# Patient Record
Sex: Female | Born: 1955 | Race: Asian | Hispanic: No | Marital: Single | Smoking: Never smoker
Health system: Southern US, Community
[De-identification: ages and names within clinical notes are randomized; demographics above are authoritative.]

## PROBLEM LIST (undated history)

## (undated) DIAGNOSIS — K5790 Diverticulosis of intestine, part unspecified, without perforation or abscess without bleeding: Secondary | ICD-10-CM

## (undated) DIAGNOSIS — N3090 Cystitis, unspecified without hematuria: Secondary | ICD-10-CM

## (undated) DIAGNOSIS — D369 Benign neoplasm, unspecified site: Secondary | ICD-10-CM

## (undated) DIAGNOSIS — H269 Unspecified cataract: Secondary | ICD-10-CM

## (undated) DIAGNOSIS — K219 Gastro-esophageal reflux disease without esophagitis: Secondary | ICD-10-CM

## (undated) DIAGNOSIS — M12819 Other specific arthropathies, not elsewhere classified, unspecified shoulder: Secondary | ICD-10-CM

## (undated) DIAGNOSIS — M4722 Other spondylosis with radiculopathy, cervical region: Secondary | ICD-10-CM

## (undated) HISTORY — DX: Other spondylosis with radiculopathy, cervical region: M47.22

## (undated) HISTORY — DX: Benign neoplasm, unspecified site: D36.9

## (undated) HISTORY — DX: Other specific arthropathies, not elsewhere classified, unspecified shoulder: M12.819

## (undated) HISTORY — PX: COLONOSCOPY: SHX174

## (undated) HISTORY — PX: BREAST BIOPSY: SHX20

## (undated) HISTORY — DX: Unspecified cataract: H26.9

## (undated) HISTORY — DX: Cystitis, unspecified without hematuria: N30.90

## (undated) HISTORY — DX: Gastro-esophageal reflux disease without esophagitis: K21.9

## (undated) HISTORY — DX: Diverticulosis of intestine, part unspecified, without perforation or abscess without bleeding: K57.90

---

## 1995-06-18 HISTORY — PX: OVARIAN CYST SURGERY: SHX726

## 1998-12-14 ENCOUNTER — Encounter: Admission: RE | Admit: 1998-12-14 | Discharge: 1998-12-14 | Payer: Self-pay | Admitting: Hematology and Oncology

## 1998-12-14 ENCOUNTER — Ambulatory Visit (HOSPITAL_COMMUNITY): Admission: RE | Admit: 1998-12-14 | Discharge: 1998-12-14 | Payer: Self-pay | Admitting: Hematology and Oncology

## 1999-08-15 ENCOUNTER — Encounter: Admission: RE | Admit: 1999-08-15 | Discharge: 1999-08-15 | Payer: Self-pay | Admitting: Hematology and Oncology

## 1999-11-16 ENCOUNTER — Encounter: Admission: RE | Admit: 1999-11-16 | Discharge: 1999-11-16 | Payer: Self-pay | Admitting: Internal Medicine

## 1999-11-26 ENCOUNTER — Ambulatory Visit (HOSPITAL_COMMUNITY): Admission: RE | Admit: 1999-11-26 | Discharge: 1999-11-26 | Payer: Self-pay | Admitting: *Deleted

## 1999-12-18 ENCOUNTER — Encounter: Admission: RE | Admit: 1999-12-18 | Discharge: 1999-12-18 | Payer: Self-pay | Admitting: Obstetrics

## 1999-12-31 ENCOUNTER — Encounter: Admission: RE | Admit: 1999-12-31 | Discharge: 1999-12-31 | Payer: Self-pay | Admitting: Internal Medicine

## 2000-01-14 ENCOUNTER — Encounter: Admission: RE | Admit: 2000-01-14 | Discharge: 2000-01-14 | Payer: Self-pay | Admitting: Internal Medicine

## 2000-01-21 ENCOUNTER — Encounter: Admission: RE | Admit: 2000-01-21 | Discharge: 2000-01-21 | Payer: Self-pay | Admitting: Internal Medicine

## 2000-04-22 ENCOUNTER — Encounter: Admission: RE | Admit: 2000-04-22 | Discharge: 2000-04-22 | Payer: Self-pay | Admitting: Obstetrics & Gynecology

## 2000-05-22 ENCOUNTER — Ambulatory Visit (HOSPITAL_COMMUNITY): Admission: RE | Admit: 2000-05-22 | Discharge: 2000-05-22 | Payer: Self-pay | Admitting: Obstetrics & Gynecology

## 2012-05-01 ENCOUNTER — Encounter: Payer: Self-pay | Admitting: Internal Medicine

## 2012-05-01 ENCOUNTER — Ambulatory Visit (INDEPENDENT_AMBULATORY_CARE_PROVIDER_SITE_OTHER): Payer: Medicaid Other | Admitting: Internal Medicine

## 2012-05-01 VITALS — BP 123/64 | HR 72 | Temp 98.4°F | Ht 64.0 in | Wt 102.5 lb

## 2012-05-01 DIAGNOSIS — N3090 Cystitis, unspecified without hematuria: Secondary | ICD-10-CM

## 2012-05-01 DIAGNOSIS — M19019 Primary osteoarthritis, unspecified shoulder: Secondary | ICD-10-CM

## 2012-05-01 DIAGNOSIS — R42 Dizziness and giddiness: Secondary | ICD-10-CM | POA: Insufficient documentation

## 2012-05-01 DIAGNOSIS — Z1231 Encounter for screening mammogram for malignant neoplasm of breast: Secondary | ICD-10-CM

## 2012-05-01 DIAGNOSIS — M4722 Other spondylosis with radiculopathy, cervical region: Secondary | ICD-10-CM

## 2012-05-01 DIAGNOSIS — Z0000 Encounter for general adult medical examination without abnormal findings: Secondary | ICD-10-CM | POA: Insufficient documentation

## 2012-05-01 DIAGNOSIS — M4712 Other spondylosis with myelopathy, cervical region: Secondary | ICD-10-CM

## 2012-05-01 DIAGNOSIS — M12819 Other specific arthropathies, not elsewhere classified, unspecified shoulder: Secondary | ICD-10-CM

## 2012-05-01 DIAGNOSIS — Z23 Encounter for immunization: Secondary | ICD-10-CM

## 2012-05-01 DIAGNOSIS — R300 Dysuria: Secondary | ICD-10-CM

## 2012-05-01 DIAGNOSIS — L299 Pruritus, unspecified: Secondary | ICD-10-CM | POA: Insufficient documentation

## 2012-05-01 DIAGNOSIS — K219 Gastro-esophageal reflux disease without esophagitis: Secondary | ICD-10-CM

## 2012-05-01 HISTORY — DX: Cystitis, unspecified without hematuria: N30.90

## 2012-05-01 HISTORY — DX: Other specific arthropathies, not elsewhere classified, unspecified shoulder: M12.819

## 2012-05-01 HISTORY — DX: Gastro-esophageal reflux disease without esophagitis: K21.9

## 2012-05-01 HISTORY — DX: Other spondylosis with radiculopathy, cervical region: M47.22

## 2012-05-01 LAB — POCT URINALYSIS DIPSTICK
Glucose, UA: NEGATIVE
Nitrite, UA: NEGATIVE
Protein, UA: NEGATIVE
Urobilinogen, UA: 0.2

## 2012-05-01 MED ORDER — RANITIDINE HCL 150 MG PO TABS: 150.0000 mg | ORAL_TABLET | Freq: Two times a day (BID) | ORAL | Status: DC

## 2012-05-01 MED ORDER — LORATADINE 10 MG PO TABS: 10.0000 mg | ORAL_TABLET | Freq: Every day | ORAL | Status: DC

## 2012-05-01 MED ORDER — IBUPROFEN 600 MG PO TABS: 600.0000 mg | ORAL_TABLET | Freq: Three times a day (TID) | ORAL | Status: DC | PRN

## 2012-05-01 MED ORDER — SULFAMETHOXAZOLE-TRIMETHOPRIM 800-160 MG PO TABS: 1.0000 | ORAL_TABLET | Freq: Two times a day (BID) | ORAL | Status: AC

## 2012-05-01 NOTE — Assessment & Plan Note (Signed)
She currently has adequate symptom control with PRN ranitidine.  I advised patient that she can take this twice daily every day, but that PRN usage is fine. - continue ranitidine 150mg  BID or PRN

## 2012-05-01 NOTE — Patient Instructions (Addendum)
Start taking ibuprofen 600mg  every 8 hours as needed for your pain.  If ibuprofen is not enough, you can continue taking cyclobenzaprine and hydrocodone-acetaminophen as needed.  Follow up with physical therapy for treatment of you shoulder and arm pain.  Continue taking ranitidine twice a day for acid reflux.  Stop taking meclizine.  Meclizine does not help your type of dizziness.  We are scheduling you for a screening colonoscopy to screen for colon cancer and a mammogram to screen for breast cancer.  Take trimethoprim-sulfamethoxazole (Bactrim DS) every 12 hours for 6 doses (3 days) for a urinary tract infection that was causing the burning with urination.

## 2012-05-01 NOTE — Assessment & Plan Note (Addendum)
Pain and numbness radiating from shoulder into arm and hand with a positive Spurling test very suggestive of cervical spondylosis with radiculopathy.  More distal disruption like in the brachial plexus is possible, but less likely.  We will treat conservatively for now with ibuprofen and referral to physical therapy.  No imaging for now. - ibuprofen 600mg  q8hr PRN - PT

## 2012-05-01 NOTE — Assessment & Plan Note (Signed)
In addition to cervical spondylosis, it seems she has some rotator cuff arthropathy since she has pain restricting internal and external rotation and abduction.  She may have adehsive capsulitis since she requires her left arm to free her right arm from time to time.  Regardless, treatment is similar.  We will treat with ibuprofen and physical therapy. - ibuprofen 600mg  q8 PRN - PT

## 2012-05-01 NOTE — Progress Notes (Signed)
Subjective:    Patient ID: Dana Schultz, female    DOB: May 08, 1956, 56 y.o.   MRN: [id number]  CC: right shoulder pain  HPI:  This is a 56 year old woman who immigrated to the [place] in 1992 from [place] who presents today with an acute complaint and to establish care with [place].  Her shoulder pain began about 3 months ago, but has been worsening over the past 2 to 3 weeks.  No provoking injury can be identified.  Quality is described as aching and a heaviness.  Location is posterior over the scapula with radiation into the arm, forearm, and hand.  Severity is rated 8/10.  Timing is all day, every day.  Lifting things and doing housework aggravates the pain.  Hydrocodone-acetaminophen prescribed by urgent care alleviates the pain.  Cyclobenzaprine, also prescribed by urgent care, does nothing to the pain.  Associated symptoms are occasional neck pain and numbness in the forearm and hand that is relieved by moving her arm and hand around.   Review of Systems  Constitutional: Negative.  Negative for fever and chills.  HENT: Negative for congestion, rhinorrhea and sinus pressure.   Eyes: Negative.  Negative for itching.  Respiratory: Negative.  Negative for shortness of breath.   Cardiovascular: Negative.  Negative for chest pain.  Gastrointestinal: Negative for abdominal pain, diarrhea and constipation.       Occasional reflux  Genitourinary: Positive for dysuria. Negative for hematuria.  Musculoskeletal: Positive for back pain and arthralgias.  Skin: Positive for rash (occasional pruritis).  Neurological: Positive for dizziness, light-headedness and numbness. Negative for weakness and headaches.  Psychiatric/Behavioral: Negative.  Negative for dysphoric mood.    Medical History: Past Medical History  Diagnosis Date  . Pruritis 05/01/2012  . Orthostatic lightheadedness 05/01/2012  . Mild acid reflux 05/01/2012  . Cervical spondylosis with radiculopathy 05/01/2012  . Rotator cuff arthropathy  05/01/2012    Surgical History: Past Surgical History  Procedure Date  . Ovarian cyst surgery 1997    Prior Medications: Name Route Sig  . CYCLOBENZAPRINE HCL 5 MG PO TABS Oral  Take 5 mg by mouth 3 (three) times daily as needed.  Marland Kitchen HYDROCODONE-ACETAMINOPHEN 5-325 MG PO TABS Oral  Take 1 tablet by mouth every 6 (six) hours as needed.  . MECLIZINE 25 MG PO TABS  Oral  Take 1 tablet (25mg  total) by mouth 3 (three) times daily as needed.  Marland Kitchen LORATADINE 10 MG PO TABS  Oral  Take 1 tablet (10 mg total) by mouth daily.   Marland Kitchen RANITIDINE HCL 150 MG PO TABS  Oral  Take 1 tablet (150 mg total) by mouth 2 (two) times daily.    Allergies: Allergies as of 05/01/2012  . (No Known Allergies)    Family History: Family History  Problem Relation Age of Onset  . Cancer Neg Hx   . Heart disease Neg Hx   . Stroke Neg Hx     Social History: History   Social History  . Marital Status: Single    Number of Children: 0   Social History Main Topics  . Smoking status: Never Smoker   . Smokeless tobacco: Never Used  . Alcohol Use: No  . Drug Use: No  . Sexually Active: No     Comment: Never   Social History Narrative   Immigrated to [place] in [place] from [place].  Lived in [place] until about 4 years ago when she moved to [place].  Lives with her niece.  Objective:   Physical Exam GENERAL: well developed, well nourished; no acute distress HEAD: atraumatic, normocephalic EYES: pupils equal, round and reactive; sclera anicteric; normal conjunctiva EARS: canals patent, TMs scarred bilaterally without perforation, hearing equal bilaterally tested with tuning fork NOSE/THROAT: oropharynx clear, moist mucous membranes NECK: supple, no carotid bruits, thyroid normal in size and without palpable nodules LYMPH: no cervical or supraclavicular lymphadenopathy LUNGS: clear to auscultation bilaterally, normal work of breathing HEART: normal rate and regular rhythm; normal  S1 and S2 without S3 or S4; no murmurs, rubs, or clicks PULSES: radial and dorsalis pedis 2+ and symmetric ABDOMEN: soft, non-tender, normal bowel sounds MSK: full range of motion of the fingers, wrist, and elbow bilaterally; full range of motion of the left shoulder; right shoulder abduction limited to 90 degrees by pain; right shoulder internal and external rotation limited by pain; 3/5 arm elevation with 45-degree arm abduction (empty-can test) on the right limited by pain, 4/5 on the left; Spurling test positive with pain in the right lateral neck and shoulder with axial load; tenderness with deep palpation over the right scapular spine and over the right A-C joint; no tenderness with palpation of the fingers, hand, wrist, forearm, elbow, and arm bilaterally, and the left shoulder MOTOR: 5/5 grip, arm flexion, arm extension; 3/5 arm elevation with 45-degree arm abduction (empty-can test) on the right limited by pain, 4/5 on the left SENSATION: intact grossly in the hands and forearms bilaterally REFLEXES: 2+ biceps and patellar tendon (symmetric) CRANIAL NERVES: pupils reactive to light bilaterally; hearing is equal bilaterally, tested with a tuning fork; uvula is midline and palate elevates symmetrically; tongue protrudes midline. SKIN: warm, dry, intact, normal turgor, no rashes EXTREMITIES: no peripheral edema, no clubbing PSYCH: exam limited by language barrier, phone interpreter used, patient was alert and oriented, mood and affect seemed normal and congruent, thought content was normal without delusions, thought process was linear, behavior was normal   Filed Vitals:   05/01/12 1530  BP: 123/64  Pulse: 72  Temp: 98.4 F (36.9 C)        Assessment & Plan:

## 2012-05-01 NOTE — Assessment & Plan Note (Signed)
Treating empirically with trimethoprim-sulfamethoxazole 160-800mg  BID for 3 days.  Urinalysis    Component Value Date/Time   BILIRUBINUR Negative 05/01/2012 1647   UROBILINOGEN 0.2 05/01/2012 1647   NITRITE Negative 05/01/2012 1647   LEUKOCYTESUR Trace 05/01/2012 1647

## 2012-05-01 NOTE — Assessment & Plan Note (Addendum)
Cancer screening: appointment for colonoscopy and mammograms made today.  Immunizations: influenza and Tdap given today.  Lipid panel drawn today.  Pap smear at next visit.  ADDENDUM:  Based on lipid profile and other patient characteristics, this patients 10-year risk for CVD is 7.3% by Framingham and 3.5% by the Pooled Cohort Equation.  Based on new recommendations for lipid-lowering therapy, she does not require statin therapy at this time, though her LDL is borderline high.  We will offer lifestyle recommendations at her next visit and monitor her LDL very closely.  Lipid Panel  Component Value Date/Time   CHOL 277* 05/01/2012 1634   TRIG 185* 05/01/2012 1634   HDL 52 05/01/2012 1634   LDLCALC 188* 05/01/2012 1634

## 2012-05-01 NOTE — Assessment & Plan Note (Signed)
Occasional pruritis without visible rash consistent with dry skin eczema.  Symptoms are adequately controlled with loratadine 10mg  PRN.  I advised patient that she can take this every day if she needs to but that PRN is fine if it adequately controls her symptoms. - continue loratadine 10mg  QD or PRN

## 2012-05-02 LAB — LIPID PANEL
Cholesterol: 277 mg/dL — ABNORMAL HIGH (ref 0–200)
HDL: 52 mg/dL (ref >39)
LDL Cholesterol: 188 mg/dL — ABNORMAL HIGH (ref 0–99)
Total CHOL/HDL Ratio: 5.3 Ratio
Triglycerides: 185 mg/dL — ABNORMAL HIGH (ref <150)
VLDL: 37 mg/dL (ref 0–40)

## 2012-05-18 ENCOUNTER — Ambulatory Visit: Payer: Medicaid Other | Attending: Internal Medicine | Admitting: Physical Therapy

## 2012-05-18 DIAGNOSIS — M2560 Stiffness of unspecified joint, not elsewhere classified: Secondary | ICD-10-CM | POA: Insufficient documentation

## 2012-05-18 DIAGNOSIS — IMO0001 Reserved for inherently not codable concepts without codable children: Secondary | ICD-10-CM | POA: Insufficient documentation

## 2012-05-18 DIAGNOSIS — M25519 Pain in unspecified shoulder: Secondary | ICD-10-CM | POA: Insufficient documentation

## 2012-05-18 DIAGNOSIS — M542 Cervicalgia: Secondary | ICD-10-CM | POA: Insufficient documentation

## 2012-05-18 DIAGNOSIS — R293 Abnormal posture: Secondary | ICD-10-CM | POA: Insufficient documentation

## 2012-05-25 ENCOUNTER — Ambulatory Visit (HOSPITAL_COMMUNITY)
Admission: RE | Admit: 2012-05-25 | Discharge: 2012-05-25 | Disposition: A | Discharge status: Home / Self Care | Payer: Medicaid Other | Source: Ambulatory Visit | Attending: Internal Medicine | Admitting: Internal Medicine

## 2012-05-25 DIAGNOSIS — Z1231 Encounter for screening mammogram for malignant neoplasm of breast: Secondary | ICD-10-CM | POA: Insufficient documentation

## 2012-05-27 ENCOUNTER — Ambulatory Visit: Payer: Medicaid Other | Admitting: Rehabilitation

## 2012-05-27 ENCOUNTER — Other Ambulatory Visit: Payer: Self-pay | Admitting: Internal Medicine

## 2012-05-27 DIAGNOSIS — R928 Other abnormal and inconclusive findings on diagnostic imaging of breast: Secondary | ICD-10-CM

## 2012-06-01 ENCOUNTER — Encounter: Payer: Medicaid Other | Admitting: Rehabilitation

## 2012-06-01 ENCOUNTER — Ambulatory Visit
Admission: RE | Admit: 2012-06-01 | Discharge: 2012-06-01 | Disposition: A | Discharge status: Home / Self Care | Payer: Medicaid Other | Source: Ambulatory Visit | Attending: Internal Medicine | Admitting: Internal Medicine

## 2012-06-01 DIAGNOSIS — R928 Other abnormal and inconclusive findings on diagnostic imaging of breast: Secondary | ICD-10-CM

## 2012-06-05 ENCOUNTER — Other Ambulatory Visit (HOSPITAL_COMMUNITY)
Admission: RE | Admit: 2012-06-05 | Discharge: 2012-06-05 | Disposition: A | Discharge status: Home / Self Care | Payer: Medicaid Other | Source: Ambulatory Visit | Attending: Internal Medicine | Admitting: Internal Medicine

## 2012-06-05 ENCOUNTER — Encounter: Payer: Self-pay | Admitting: Internal Medicine

## 2012-06-05 ENCOUNTER — Ambulatory Visit (INDEPENDENT_AMBULATORY_CARE_PROVIDER_SITE_OTHER): Payer: Medicaid Other | Admitting: Internal Medicine

## 2012-06-05 VITALS — BP 118/80 | HR 78 | Temp 98.3°F | Resp 20 | Ht <= 58 in | Wt 102.4 lb

## 2012-06-05 DIAGNOSIS — Z1151 Encounter for screening for human papillomavirus (HPV): Secondary | ICD-10-CM | POA: Insufficient documentation

## 2012-06-05 DIAGNOSIS — M12819 Other specific arthropathies, not elsewhere classified, unspecified shoulder: Secondary | ICD-10-CM

## 2012-06-05 DIAGNOSIS — Z124 Encounter for screening for malignant neoplasm of cervix: Secondary | ICD-10-CM

## 2012-06-05 DIAGNOSIS — M19019 Primary osteoarthritis, unspecified shoulder: Secondary | ICD-10-CM

## 2012-06-05 DIAGNOSIS — Z01419 Encounter for gynecological examination (general) (routine) without abnormal findings: Secondary | ICD-10-CM | POA: Insufficient documentation

## 2012-06-05 DIAGNOSIS — Z0000 Encounter for general adult medical examination without abnormal findings: Secondary | ICD-10-CM

## 2012-06-05 NOTE — Assessment & Plan Note (Addendum)
The breast center is working up suspicious calcifications on screening mammography. She plans to attend her visit on Monday for screening colonoscopy. Pap smear today. Her LDL was borderline high at 188 but with her other risk factors she is at very low risk for cardiovascular disease. She does not currently meet criteria for statin therapy based on the recommendations. We will recheck lipids in one year. She was given information on lifestyle modifications for cholesterol control.  ADDENDUM: pap smear was negative

## 2012-06-05 NOTE — Patient Instructions (Signed)
General Instructions:  Your screening colonoscopy is scheduled for Monday, December 23rd.  Continue to follow-up with the Breast Center.  Keep doing you exercises for you shoulder and neck.   Treatment Goals:  Goals (1 Years of Data) as of 06/05/2012          As of Today 05/01/12     Blood Pressure    . Blood Pressure < 140/90  118/80 123/64     Result Component    . LDL CALC < 190   188      Care Management & Community Referrals:  Referral 06/05/2012  Referrals made to community resources exercise/physical therapy

## 2012-06-05 NOTE — Assessment & Plan Note (Signed)
Improved. She will have one more visit with PT. Continue with her exercise and stretching program. She will use ibuprofen 600 as needed for pain.

## 2012-06-05 NOTE — Progress Notes (Signed)
  Subjective:    Patient ID: Dana Schultz, female    DOB: June 28, 1955, 56 y.o.   MRN: [id number]  HPI:  This is a 56 year old female with rotator cuff arthropathy on the right and cervical spondylosis with radiculopathy, presenting for followup. At her last visit, I referred her to physical therapy. The exercises that she has learned from physical therapy have worked very well. She wakes with some moderate pain in her neck and right shoulder but after she does her morning exercises she is essentially pain-free and has very little limitations throughout the day. She repeats her exercises in the afternoon. She does have a prescription for ibuprofen 600 mg. She has taking this twice and does not really feel the need to take any pain medicine at this time. She is being worked up by the breast center for some suspicious calcifications on screening mammography. She will undergo a screening colonoscopy on Monday.   Review of Systems  All other systems reviewed and are negative.       Objective:   Physical Exam GENERAL: well developed, well nourished; no acute distress LUNGS: clear to auscultation bilaterally, normal work of breathing HEART: normal rate and regular rhythm; normal S1 and S2 without S3 or S4; no murmurs, rubs, or clicks PULSES: radial 2+ and symmetric ABDOMEN: soft, non-tender, normal bowel sounds SKIN: warm, dry, intact, normal turgor, no rashes EXTREMITIES: no peripheral edema, no clubbing or cyanosis GU: normal external genitalia, normal-appearing vaginal vault and cervix without blood or exudates, endocervical & exocervical Papanicolaou sample obtained   Filed Vitals:   06/05/12 1525  BP: 118/80  Pulse: 78  Temp: 98.3 F (36.8 C)  Resp: 20    BP Readings from Last 3 Encounters:  06/05/12 118/80  05/01/12 123/64        Assessment & Plan:

## 2012-06-08 ENCOUNTER — Ambulatory Visit (AMBULATORY_SURGERY_CENTER): Payer: Medicaid Other | Admitting: *Deleted

## 2012-06-08 VITALS — Ht <= 58 in | Wt 103.0 lb

## 2012-06-08 DIAGNOSIS — Z1211 Encounter for screening for malignant neoplasm of colon: Secondary | ICD-10-CM

## 2012-06-08 MED ORDER — MOVIPREP 100 G PO SOLR: ORAL | Status: DC

## 2012-06-08 NOTE — Progress Notes (Signed)
Pt has niece, Doristine Church with her today in [place].  Pt signed release for Ms Blase Mess to be her interpreter.  She will come with pt day of procedure to interpret.

## 2012-06-22 ENCOUNTER — Ambulatory Visit (AMBULATORY_SURGERY_CENTER): Payer: Medicaid Other | Admitting: Internal Medicine

## 2012-06-22 ENCOUNTER — Encounter: Payer: Self-pay | Admitting: Internal Medicine

## 2012-06-22 VITALS — BP 109/67 | HR 76 | Temp 98.3°F | Resp 21 | Ht <= 58 in | Wt 103.0 lb

## 2012-06-22 DIAGNOSIS — D126 Benign neoplasm of colon, unspecified: Secondary | ICD-10-CM

## 2012-06-22 DIAGNOSIS — Z1211 Encounter for screening for malignant neoplasm of colon: Secondary | ICD-10-CM

## 2012-06-22 MED ORDER — SODIUM CHLORIDE 0.9 % IV SOLN: 500.0000 mL | INTRAVENOUS | Status: DC

## 2012-06-22 NOTE — Op Note (Signed)
[facility] [address] [place], [address]   COLONOSCOPY PROCEDURE REPORT  PATIENT: Dana Schultz, Dana Schultz  MR#: [id number] BIRTHDATE: 10/20/1955 , 56  yrs. old GENDER: Female ENDOSCOPIST: Beverley Fiedler, MD REFERRED BY: Gwynn Burly MD PROCEDURE DATE:  06/22/2012 PROCEDURE:   Colonoscopy with snare polypectomy ASA CLASS:   Class II INDICATIONS:average risk screening and first colonoscopy. MEDICATIONS: MAC sedation, administered by CRNA and propofol (Diprivan) 200mg  IV  DESCRIPTION OF PROCEDURE:   After the risks benefits and alternatives of the procedure were thoroughly explained, informed consent was obtained.  A digital rectal exam revealed no rectal mass.   The LB CF-H180AL [id number] and LB PCF-H180AL [id number] endoscope was introduced through the anus and advanced to the terminal ileum which was intubated for a short distance. No adverse events experienced.   The quality of the prep was good, using MoviPrep  The instrument was then slowly withdrawn as the colon was fully examined.  COLON FINDINGS: The mucosa appeared normal in the terminal ileum. Two sessile polyps measuring 5 mm in size were found in the ascending colon and sigmoid colon.  Polypectomy was performed using cold snare.  All resections were complete and all polyp tissue was completely retrieved.   There was mild scattered diverticulosis noted in the ascending colon.  Retroflexed views revealed internal hemorrhoids. The time to cecum=5 minutes 24 seconds.  Withdrawal time=10 minutes 34 seconds.  The scope was withdrawn and the procedure completed. COMPLICATIONS: There were no complications.  ENDOSCOPIC IMPRESSION: 1.   Normal mucosa in the terminal ileum 2.   Two sessile polyps measuring 5 mm in size were found in the ascending colon and sigmoid colon; Polypectomy was performed using cold snare 3.   There was mild diverticulosis noted in the ascending colon  RECOMMENDATIONS: 1.  Hold aspirin,  aspirin products, and anti-inflammatory medication for 1 week. 2.  Await pathology results 3.  High fiber diet 4.  If the polyps removed today are proven to be adenomatous (pre-cancerous) polyps, you will need a repeat colonoscopy in 5 years.  Otherwise you should continue to follow colorectal cancer screening guidelines for "routine risk" patients with colonoscopy in 10 years.  You will receive a letter within 1-2 weeks with the results of your biopsy as well as final recommendations.  Please call my office if you have not received a letter after 3 weeks.   eSigned:  Beverley Fiedler, MD 06/22/2012 11:31 AM           cc: The Patient  and Gwynn Burly, MD

## 2012-06-22 NOTE — Progress Notes (Signed)
Patient did not have preoperative order for IV antibiotic SSI prophylaxis. (G8918)  Patient did not experience any of the following events: a burn prior to discharge; a fall within the facility; wrong site/side/patient/procedure/implant event; or a hospital transfer or hospital admission upon discharge from the facility. (G8907)  

## 2012-06-22 NOTE — Patient Instructions (Addendum)
Patient did not have preoperative order for IV antibiotic SSI prophylaxis. [phone]) YOU HAD AN ENDOSCOPIC PROCEDURE TODAY AT THE [facility]: Refer to the procedure report that was given to you for any specific questions about what was found during the examination.  If the procedure report does not answer your questions, please call your gastroenterologist to clarify.  If you requested that your care partner not be given the details of your procedure findings, then the procedure report has been included in a sealed envelope for you to review at your convenience later.  YOU SHOULD EXPECT: Some feelings of bloating in the abdomen. Passage of more gas than usual.  Walking can help get rid of the air that was put into your GI tract during the procedure and reduce the bloating. If you had a lower endoscopy (such as a colonoscopy or flexible sigmoidoscopy) you may notice spotting of blood in your stool or on the toilet paper. If you underwent a bowel prep for your procedure, then you may not have a normal bowel movement for a few days.  DIET: Your first meal following the procedure should be a light meal and then it is ok to progress to your normal diet.  A half-sandwich or bowl of soup is an example of a good first meal.  Heavy or fried foods are harder to digest and may make you feel nauseous or bloated.  Likewise meals heavy in dairy and vegetables can cause extra gas to form and this can also increase the bloating.  Drink plenty of fluids but you should avoid alcoholic beverages for 24 hours.  ACTIVITY: Your care partner should take you home directly after the procedure.  You should plan to take it easy, moving slowly for the rest of the day.  You can resume normal activity the day after the procedure however you should NOT DRIVE or use heavy machinery for 24 hours (because of the sedation medicines used during the test).    SYMPTOMS TO REPORT IMMEDIATELY: A gastroenterologist can be reached at  any hour.  During normal business hours, 8:30 AM to 5:00 PM Monday through Friday, call [phone].  After hours and on weekends, please call the GI answering service at [phone] who will take a message and have the physician on call contact you.   Following lower endoscopy (colonoscopy or flexible sigmoidoscopy):  Excessive amounts of blood in the stool  Significant tenderness or worsening of abdominal pains  Swelling of the abdomen that is new, acute  Fever of 100F or higher  FOLLOW UP: If any biopsies were taken you will be contacted by phone or by letter within the next 1-3 weeks.  Call your gastroenterologist if you have not heard about the biopsies in 3 weeks.  Our staff will call the home number listed on your records the next business day following your procedure to check on you and address any questions or concerns that you may have at that time regarding the information given to you following your procedure. This is a courtesy call and so if there is no answer at the home number and we have not heard from you through the emergency physician on call, we will assume that you have returned to your regular daily activities without incident.  SIGNATURES/CONFIDENTIALITY: You and/or your care partner have signed paperwork which will be entered into your electronic medical record.  These signatures attest to the fact that that the information above on your After Visit Summary has been  reviewed and is understood.  Full responsibility of the confidentiality of this discharge information lies with you and/or your care-partner.   Thank-you for choosing [place] for your healthcare needs.

## 2012-06-22 NOTE — Progress Notes (Signed)
Called to room to assist during endoscopic procedure.  Patient ID and intended procedure confirmed with present staff. Received instructions for my participation in the procedure from the performing physician.  

## 2012-06-23 ENCOUNTER — Telehealth: Payer: Self-pay | Admitting: *Deleted

## 2012-06-23 NOTE — Telephone Encounter (Signed)
  Follow up Call-  Call back number 06/22/2012  Post procedure Call Back phone  # [phone]  Permission to leave phone message Yes     Patient questions:  Do you have a fever, pain , or abdominal swelling? no Pain Score  0 *  Have you tolerated food without any problems? yes  Have you been able to return to your normal activities? yes  Do you have any questions about your discharge instructions: Diet   no Medications  no Follow up visit  no  Do you have questions or concerns about your Care? no  Actions: * If pain score is 4 or above: No action needed, pain <4.

## 2012-06-26 ENCOUNTER — Encounter: Payer: Self-pay | Admitting: Internal Medicine

## 2012-08-28 ENCOUNTER — Encounter: Payer: Self-pay | Admitting: Internal Medicine

## 2012-11-12 ENCOUNTER — Encounter: Payer: Self-pay | Admitting: Internal Medicine

## 2012-11-12 ENCOUNTER — Ambulatory Visit (INDEPENDENT_AMBULATORY_CARE_PROVIDER_SITE_OTHER): Payer: Medicaid Other | Admitting: Internal Medicine

## 2012-11-12 VITALS — BP 118/83 | HR 80 | Temp 97.8°F | Ht <= 58 in | Wt 104.7 lb

## 2012-11-12 DIAGNOSIS — E785 Hyperlipidemia, unspecified: Secondary | ICD-10-CM

## 2012-11-12 DIAGNOSIS — R42 Dizziness and giddiness: Secondary | ICD-10-CM

## 2012-11-12 DIAGNOSIS — R5383 Other fatigue: Secondary | ICD-10-CM

## 2012-11-12 DIAGNOSIS — M12819 Other specific arthropathies, not elsewhere classified, unspecified shoulder: Secondary | ICD-10-CM

## 2012-11-12 DIAGNOSIS — R531 Weakness: Secondary | ICD-10-CM | POA: Insufficient documentation

## 2012-11-12 DIAGNOSIS — M19019 Primary osteoarthritis, unspecified shoulder: Secondary | ICD-10-CM

## 2012-11-12 LAB — COMPLETE METABOLIC PANEL WITH GFR
Albumin: 4.1 g/dL (ref 3.5–5.2)
BUN: 16 mg/dL (ref 6–23)
CO2: 29 mEq/L (ref 19–32)
GFR, Est African American: 84 mL/min
GFR, Est Non African American: 73 mL/min
Glucose, Bld: 77 mg/dL (ref 70–99)
Sodium: 139 mEq/L (ref 135–145)
Total Bilirubin: 0.4 mg/dL (ref 0.3–1.2)
Total Protein: 7.2 g/dL (ref 6.0–8.3)

## 2012-11-12 LAB — CBC
HCT: 39.2 % (ref 36.0–46.0)
Hemoglobin: 12.9 g/dL (ref 12.0–15.0)
MCH: 27.9 pg (ref 26.0–34.0)
MCHC: 32.9 g/dL (ref 30.0–36.0)
RBC: 4.63 MIL/uL (ref 3.87–5.11)

## 2012-11-12 NOTE — Assessment & Plan Note (Signed)
Unclear about patient's dizziness spells. As described in history of present illness- does not fit any specific diagnosis. Is unlikely to be central CNS pathology. Really low risk for CVA. Also less likely peripheral vestibular pathology. Most likely intermittent orthostasis/autonomic dysfunction, although she has negative orthostatic vitals today.  - Check CBC and CMP - Conservative management. - If symptoms get worse, we happens everyday- and are associated with any specific findings, will start working on that. - Discussed this with patient's nephew in detail.

## 2012-11-12 NOTE — Assessment & Plan Note (Signed)
Significantly improved symptoms with physical therapy.  Continue conservative management.

## 2012-11-12 NOTE — Assessment & Plan Note (Signed)
Unclear etiology. Vague symptoms not fitting any specific diagnosis. Colonoscopy and mammogram negative for carcinoma recently. And does not describe any infectious symptoms. Unclear if she is anemia. Does not describe any blood loss.  - Will check CBC and CMP to evaluate hemoglobin and electrolyte along with renal and liver functions. - If normal labs, will do conservative management with watchful waiting.

## 2012-11-12 NOTE — Patient Instructions (Signed)
Please make a followup appointment in 3-4 months or as needed.  I will give you a call if the lab test shows any abnormalities.  Continue taking all medications as you do.

## 2012-11-12 NOTE — Progress Notes (Signed)
  Subjective:    Patient ID: Dana Schultz, female    DOB: May 13, 1956, 57 y.o.   MRN: [id number]  HPI patient is a pleasant 57 year woman with rotator cuff arthropathy and other problems as per problem list who comes the clinic for evaluation of her intermittent dizziness and weakness.  She describes dizziness with room spinning around sensation which happens about once every week. Lasts for a few seconds to a minute. Is not associated with headache, syncope, nausea vomiting, palpitations, changes in vision, focal weakness or numbness. No change in gait. The dizziness gets better by itself. Does not have any recent sore throat or sinus problems. Also does not describe any changes with head movements. She does have some ringing in ear along with that, but does not have any ear fullness, decreased hearing or ear ache.   She also describes generalized weakness which also happens intermittently around once a week. Happens about 4 or 5 in the evening- and has to rest for a while before it gets better. Unclear what brings in on. Does not have any associated night sweats, fever, chills, weight loss, change in appetite.   Had recent colonoscopy in January 2014 which had 2 polyps removed which were tubular adenoma. Also had a normal mammogram within the last year.  Does not have any diarrhea, constipation, abdominal pain.    Review of Systems     as per history of present illness.  Objective:   Physical Exam  General: NAD HEENT: PERRL, EOMI, no scleral icterus Cardiac: S1, S2, RRR, no rubs, murmurs or gallops Pulm: clear to auscultation bilaterally, moving normal volumes of air Abd: soft, nontender, nondistended, BS present Ext: warm and well perfused, no pedal edema Neuro: alert and oriented X3, cranial nerves II-XII grossly intact. Normal gait.       Assessment & Plan:

## 2012-11-12 NOTE — Assessment & Plan Note (Signed)
Total cholesterol 277 with LDL cholesterol 188 in November 2013. - Patient was advised by her PCP earlier to have diet and lifestyle modifications. - No indication for starting therapy at present. - Repeat profile in November 2014.

## 2012-11-13 NOTE — Progress Notes (Signed)
Case discussed with Dr. Patel soon after the resident saw the patient.  We reviewed the resident's history and exam and pertinent patient test results.  I agree with the assessment, diagnosis and plan of care documented in the resident's note. 

## 2013-03-22 ENCOUNTER — Ambulatory Visit (INDEPENDENT_AMBULATORY_CARE_PROVIDER_SITE_OTHER): Payer: Medicaid Other | Admitting: Internal Medicine

## 2013-03-22 ENCOUNTER — Encounter: Payer: Self-pay | Admitting: Internal Medicine

## 2013-03-22 VITALS — BP 111/68 | HR 87 | Temp 97.0°F | Ht <= 58 in | Wt 103.2 lb

## 2013-03-22 DIAGNOSIS — R5381 Other malaise: Secondary | ICD-10-CM

## 2013-03-22 DIAGNOSIS — M12811 Other specific arthropathies, not elsewhere classified, right shoulder: Secondary | ICD-10-CM

## 2013-03-22 DIAGNOSIS — Z23 Encounter for immunization: Secondary | ICD-10-CM

## 2013-03-22 DIAGNOSIS — R42 Dizziness and giddiness: Secondary | ICD-10-CM | POA: Insufficient documentation

## 2013-03-22 DIAGNOSIS — R531 Weakness: Secondary | ICD-10-CM

## 2013-03-22 DIAGNOSIS — M19019 Primary osteoarthritis, unspecified shoulder: Secondary | ICD-10-CM

## 2013-03-22 LAB — TSH: TSH: 1.907 u[IU]/mL (ref 0.350–4.500)

## 2013-03-22 NOTE — Progress Notes (Signed)
Subjective:     Patient ID: Dana Schultz, female   DOB: 1956/02/01, 57 y.o.   MRN: [id number]  HPI Pt in clinic today with a friend that she lives with who serves as her interpreter. She is [place] and non-English speaking. Her friend often provides his own opinion with what is going on with her and states that it is the "usual things." It is unclear whether he gives an exact interpretation, and occasionally he answers questions for her.   This is an otherwise healthy 57 yo F w/ hx of right rotator cuff arthropathy, presenting today w/ pain in her right shoulder. This pain allegedly began years ago. It is described as sharp pain that comes and goes, is worse in the evening and aggravated by some of her exercises (e.g. Placing fist against the wall). Her ROM is also limited, and she cannot lift her shoulder all the way up without pain.    She was last seen in clinic on 5/29 and per pt's friend, she was referred to PT, which helped improve the pain. She continues to do the exercises everyday and takes Advil 600 mg occasionally for the pain.   Pt states that she is not depressed and that she does not take any additional OTC medications or herbal supplements aside from the medications listed in her record and a daily multivitamin.   Review of Systems  Constitutional: Positive for fatigue. Negative for fever, chills, diaphoresis and unexpected weight change.  HENT: Positive for ear pain and neck pain. Negative for hearing loss, congestion, sore throat, facial swelling, neck stiffness and sinus pressure.        Occasional neck pain in the back of neck. Does not radiate. Exacerbated when keeping neck in certain position for prolonged period of time. 1 ep of ear pain - nonspecific.  Eyes: Negative for visual disturbance.  Respiratory: Positive for shortness of breath.        Comes and goes for brief moments of time. Not associated with activity.  Cardiovascular: Positive for chest pain. Negative for leg  swelling.       Chest pain comes and goes and occurs on both sides of the chest at different times. Not substernal. Described as stabbing/pinching pain that lasts 4-5 min. Not associated with a specific activity. Occurs 1-2x every 1-2 weeks.  Gastrointestinal: Positive for abdominal pain. Negative for nausea, vomiting, diarrhea, constipation and blood in stool.       Abdominal pain when doing exercises such as squats.  Endocrine: Negative for cold intolerance, heat intolerance and polyuria.  Genitourinary: Negative for dysuria, frequency, hematuria and difficulty urinating.  Skin: Negative for color change.  Neurological: Positive for light-headedness. Negative for dizziness.       Light-headedness occurs every now and then. Associated with changing positions or standing. Occurs "every once in a while" and is getting better. Improvement with lying down.  Psychiatric/Behavioral: Negative for dysphoric mood.       Objective:   Physical Exam  Constitutional: She appears well-nourished. No distress.  HENT:  Head: Normocephalic and atraumatic.  Right Ear: External ear normal.  Left Ear: External ear normal.  Nose: Nose normal.  Mouth/Throat: Oropharynx is clear and moist. No oropharyngeal exudate.  Eyes: Conjunctivae and EOM are normal. Pupils are equal, round, and reactive to light. No scleral icterus.  No conjunctival pallor  Neck: Normal range of motion. Neck supple. No tracheal deviation present. No thyromegaly present.  Cardiovascular: Normal rate, regular rhythm, normal heart sounds and intact  distal pulses.  Exam reveals no gallop and no friction rub.   No murmur heard. Pulmonary/Chest: Effort normal and breath sounds normal. No respiratory distress.  Abdominal: Soft. She exhibits no distension and no mass. There is no tenderness. There is no rebound and no guarding.  Musculoskeletal: She exhibits no edema.  Strength 5/5 bilaterally in upper extremities. Pain to palpation on right  located near triceps area and on anterior and posterior sides of deltoid. Range of motion limited on right side to 90 degrees when flexing arm up from side to above her head. No apprehension or tenderness when attempting to displace shoulder anteriorly with arm in 90 degree flexion at elbow and 90 degree abduction at shoulder. + Empty can test on right side.  Lymphadenopathy:    She has no cervical adenopathy.  Skin: Skin is warm and dry. No rash noted. She is not diaphoretic. No erythema.   Filed Vitals:   03/22/13 1429  BP: 111/68  Pulse: 87  Temp: 97 F (36.1 C)  TempSrc: Oral  Height: 4' 6.5" (1.384 m)  Weight: 103 lb 3.2 oz (46.811 kg)  SpO2: 97%   Orthostatics negative. 116/78, 50 lying; 117/76, 50 sitting; 109/78, 50 standing     Assessment:     This is an otherwise healthy 57 yo F w/ hx of right rotator cuff arthropathy in clinic today with symptoms consistent with persistent rotator cuff pain and elements of orthostatic hypotension, being managed conservatively as an outpatient.     Plan:     1. Rotator cuff arthropathy - Hx and physical exam findings consistent with this dx. Previously treated w/ NSAIDs and PT. Plan is to continue current NSAIDs and refer to Sports Medicine for further management - possibility of imaging studies, more targeted exercises to help improve function and pain.   2. Orthostatic hypotension and fatigue - Pt's episodes of light-headedness usually associated with position change is consistent with orthostatic hypotension. Negative orthostatics in today's clinic. Will advise pt on symptoms of orthostatic hypotension and ways to avoid another occurrence and red flag symptoms that should prompt another appt or visit to ED. Other things to consider along with complaint of fatigue include thyroid disorder and depression. Will obtain TSH today. Pt denies depression. Will continue to monitor this issue at future visits.  3. Non-specific chest pain and SOB -  Concerning for ACS. May also be MSK related given that pain goes to both sides of chest subclavicularly and is not substernal. Hx is difficult to illicit given language barrier and friend's non-attentiveness to this issue. However, pt does not appear acutely ill, physical exam was benign and pt is currently more concerned about right shoulder pain. Given that her symptoms appear vague and are infrequent will continue to monitor for now, looking for worsening of chest pain and SOB at future visits.

## 2013-03-22 NOTE — Assessment & Plan Note (Signed)
Assessment: Episodic in nature, not related to palpitations, fainting, nausea or vomiting. Not clear about relation to posture. Plan: Check TSH today. Drink plenty water. Rise slowly from seated position. If problem persists, we will consider meclizine

## 2013-03-22 NOTE — Progress Notes (Signed)
Subjective:     Patient ID: Dana Schultz, female   DOB: 1956-06-14, 57 y.o.   MRN: [id number]  HPI 57 year old [place] lady comes in with friend who is also interpreter for her. She has past medical complaints of chronic right shoulder pain, for which she has been taking NSAIDs with little benefit. She has been seen for this 2-3 times in the past six months and remains dissatisfied. She locates her pain over the lateral aspect of arm, intermittent, with no radiation, and is unable to abduct her arm beyond 90 degrees. She denies trauma to the arm. She complains of muscle tightness in the surrounding shoulder area. She denies swelling, redness or skin color changes around the arm.  Previous notes - The patient has complained about generalized body aches and dizziness. On asking specifically, she complains about them, however she says that she gets them very infrequently. She denies generalized body aches at this time. She denies hot/cold intolerance. She denies weight gain or loss. She denies sad mood. She denies appetite change. She denies taking any new medication - over the counter or herbal. She denies chest pain, palpitation, shortness of breath, new cough, new fever, etc. She denies abdominal pain, nausea, vomiting, or new rashes.    My student notes that the interpreter was answering for the patient at times, and I agree with that. However, to elicit correct information, I guided the interpreter several times to ask the patient the question and not answer for the patient. I think he was doing this due to his familiarity with the patient. However, he did comply to my requests and asked the patient direct questions and translated them for [place].   Telephone call to the patient after the visit - 03/23/13 8:40 am   After reading my student's note I noticed that the patient's interpreter has given different histories regarding chest pain and shortness of breath to me and my student. I was not made aware of  the chest pain by my student before meeting the patient. I called the patient's niece Dana Schultz at [phone] and explained to her how we missed working up her aunt's chest pain. She reported that her aunt is doing okay, but sometimes complains of chest pain. I asked her to bring her to the clinic again today so I can personally work up the chest pain. She replied that she will. I have notified the clinic staff about this possible appointment and they will inform me today if the patient comes in. I will take detailed history regarding chest pain and work the patient up for the same.    Review of Systems See HPI    Objective:   Physical Exam General: No acute distress.  HEENT: PERRL, EOMI.   CV: S1S2 RRR, no murmur Lungs: Bilateral Vesicular breath sounds.  Abdomen: Soft, non-tender, benign Pedal Edema: absent Pedal pulses present.  Muscluloskeletel: Right shoulder examined. No overlying swelling, or erythema. Limited range of motion in abduction, especially between 60-120 degrees.  Neuro: Alert and oriented times 3. No gross deficits seen.      Assessment:     Please see problem based charting.  This visit we referred the patient to sports medicine for further workup of her shoulder pain, likely rotator cuff pathology. Okay to take NSAIDs PRN. Continue PT exercises. Orthostatic vitals negative. Plan to instruct her to keep hydrated, and avoid rising suddenly from seated position.

## 2013-03-22 NOTE — Patient Instructions (Addendum)
It was a pleasure seeing you in clinic today.  We believe that you have a rotator cuff arthropathy that requires specialized management. Today we are arranging for you to see Sports Medicine to further evaluate your right shoulder.  We are also drawing labs to check the functioning of your thyroid.    When changing positions, make these changes slowly. This allows your body to adjust to the different position.  Compression stockings that are worn on your lower legs may be helpful.  Eat frequent, small meals. Avoid sudden standing after eating.  Avoid hot showers or excessive heat.  SEEK IMMEDIATE MEDICAL CARE IF:   You faint or have a near-fainting episode. Call your local emergency services (911 in [place]  You have or develop chest pain.  You feel sick to your stomach (nauseous) or vomit.  You have a loss of feeling or movement in your arms or legs.  You have difficulty talking, slurred speech, or you are unable to talk.  You have difficulty thinking or have confused thinking.

## 2013-03-22 NOTE — Assessment & Plan Note (Signed)
Assessment: Rotator Cuff Arthropathy which seemed to be unresolved and the patient is not satisfied with conservative measures of NSAIDs and PT.  Plan: Refer to sports medicine for further work up.

## 2013-03-22 NOTE — Assessment & Plan Note (Signed)
Assessment: Unclear Etiology. The patient denies any thyroid related symptoms. Her orthostatic vitals are negative. Plan: Instruction to keep well hydrated with oral water intake, Check TSH today.

## 2013-03-23 ENCOUNTER — Telehealth: Payer: Self-pay | Admitting: *Deleted

## 2013-03-23 ENCOUNTER — Ambulatory Visit (INDEPENDENT_AMBULATORY_CARE_PROVIDER_SITE_OTHER): Payer: Medicaid Other | Admitting: Internal Medicine

## 2013-03-23 ENCOUNTER — Encounter: Payer: Self-pay | Admitting: Internal Medicine

## 2013-03-23 VITALS — BP 116/76 | HR 84 | Temp 97.8°F | Wt 104.3 lb

## 2013-03-23 DIAGNOSIS — R079 Chest pain, unspecified: Secondary | ICD-10-CM

## 2013-03-23 NOTE — Progress Notes (Signed)
Patient ID: Dana Schultz, female   DOB: 1956-04-16, 57 y.o.   MRN: [id number]    Brief Encounter note   Dana, Schultz is a 57 year old lady who I saw yesterday with the medical student and there was confusion regarding chest pain - the patients interpreter had reported frequent chest pains and shortness of breath to my student, and denied them to me. I noticed this discrepancy when the patient had left the clinic. I called the patient and her interpreter (who is her family friend) back in the clinic today.   Apparently, the patient does not have frequent chest pains at all. She says that she may be had some pressure like sensation 6 months back, when she squeezed a towel dry. She completely denies any shortness of breath - not with walking around the house, not with climbing stairs. She is unable to rate the pain level, but it does not concern her. She is mostly concerned about her shoulder pain (which is discussed in the previous/yesterdays visit). She denies orthopnea, or PND.   Brief Physical Exam   Alert and Oriented Neck - no JVD Chest - No tenderness on palpation. S1S2 RRR. No murmurs.  Lungs - Bilateral Vesicular sounds. No pedal edema as noted yesterday.   EKG   We did an EKG this visit. It was normal sinus rhythm with features of Incomplete RBBB, QRS duration of 92ms, rsR' in aVL, V1-V3 and S in V5-V6, associated TI in V1-v3. We have no prior EKGs to compare this with.  Assessment   At this time, we would observe the patient for any further symptoms. For CHD risk categorization: She does not have or does not know about Heart disease in her family. Her lipid panel was done last year which had appreciable HDL of 52 and LDL of 188. She is a non-smoker and non-drinker. She does not use drugs. She is not overweight. Her Framingham Risk Score is 2%. Her Reynolds risk score cannot be calculated as we do not have hsCRP value - and given the low risk profile I do not plan to do it.  Lipid Panel      Component Value Date/Time   CHOL 277* 05/01/2012 1634   TRIG 185* 05/01/2012 1634   HDL 52 05/01/2012 1634   CHOLHDL 5.3 05/01/2012 1634   VLDL 37 05/01/2012 1634   LDLCALC 188* 05/01/2012 1634    Plan    Next visit we can repeat a lipid panel.   She declined the use of statins this visit. Next visit we can revisit it.   Instructed the patient about healthier diet.  Rest plan same as yesterdays visit. For the shoulder pain, the patient has an appointment with sports medicine for 10/59 already with Dr. Jennette Kettle.

## 2013-03-24 NOTE — Telephone Encounter (Signed)
Pt seen

## 2013-04-02 ENCOUNTER — Ambulatory Visit: Payer: Medicaid Other | Admitting: Family Medicine

## 2013-04-19 ENCOUNTER — Ambulatory Visit: Payer: Medicaid Other | Admitting: Sports Medicine

## 2013-04-19 NOTE — Addendum Note (Signed)
Addended by: Neomia Dear on: 04/19/2013 06:04 PM   Modules accepted: Orders

## 2013-05-07 IMAGING — MG MM DIGITAL SCREENING BILAT
4 series · 4 of 4 positions shown · non-contrast
Comparison: None.

CLINICAL DATA: Screening.

DIGITAL BILATERAL SCREENING MAMMOGRAM WITH CAD

[R CC]
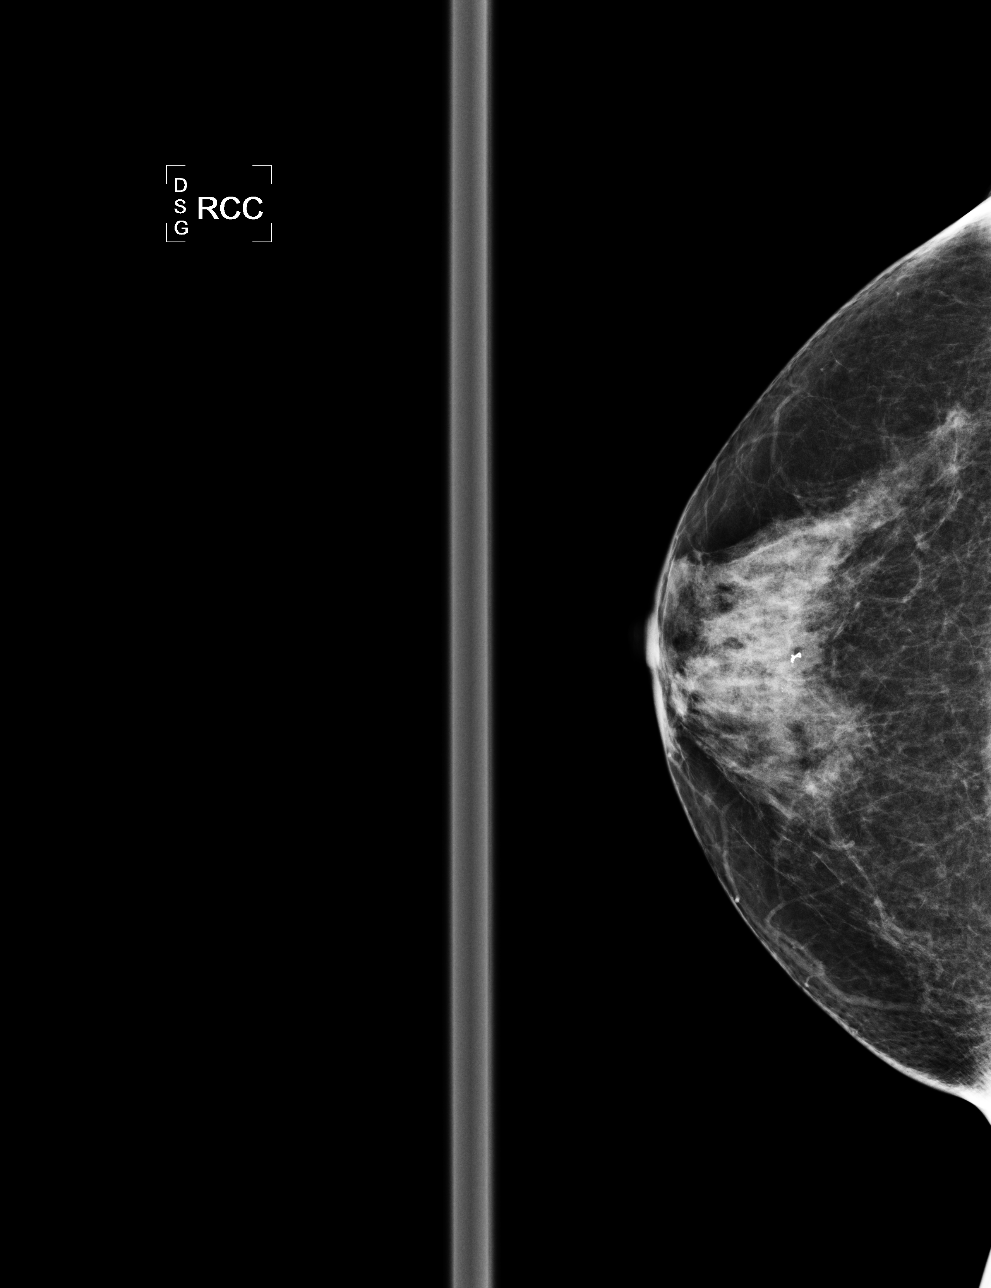

[R MLO]
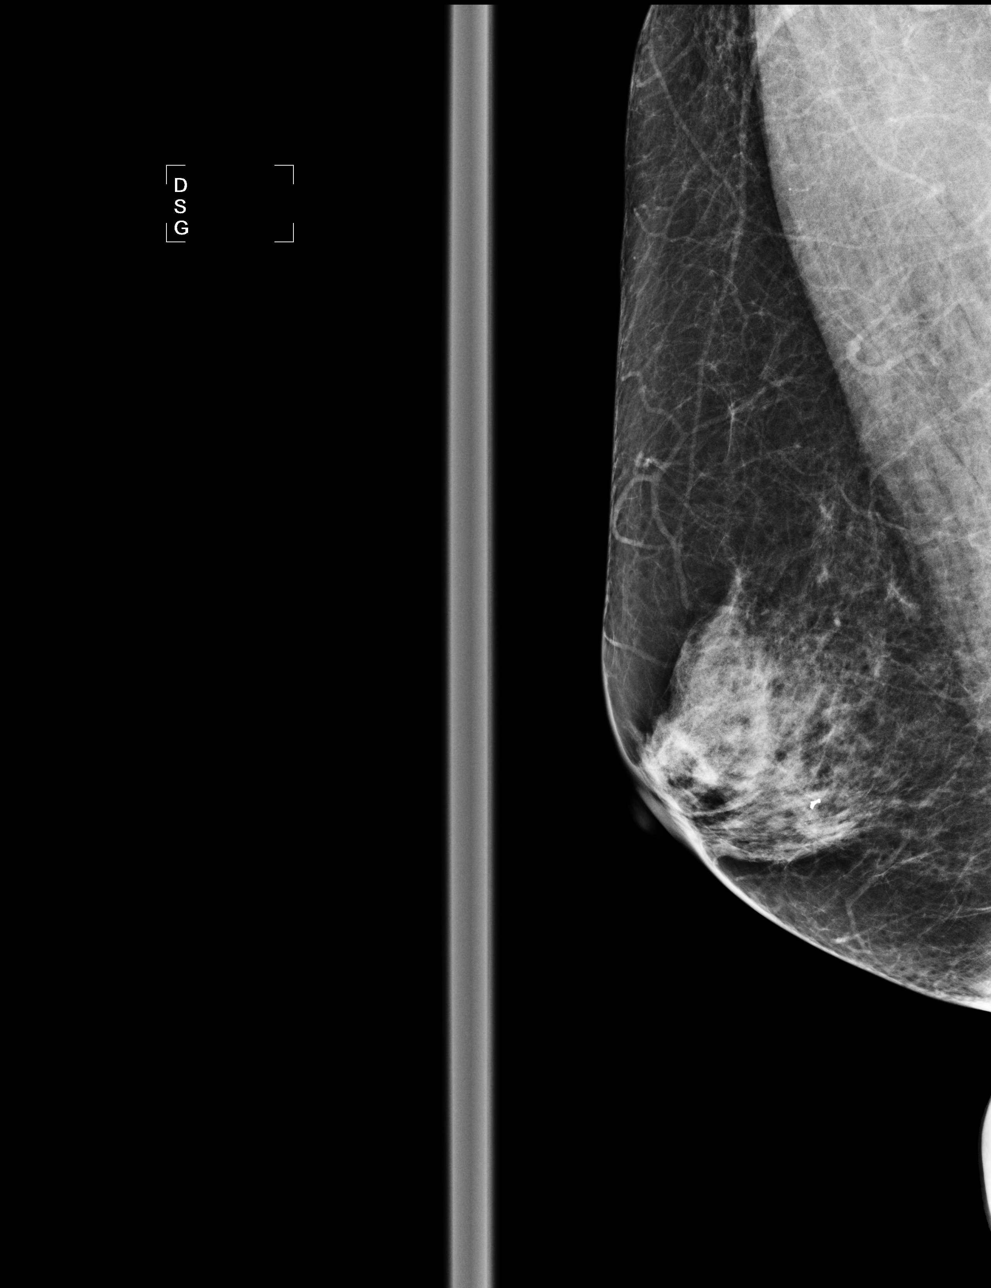

[L CC]
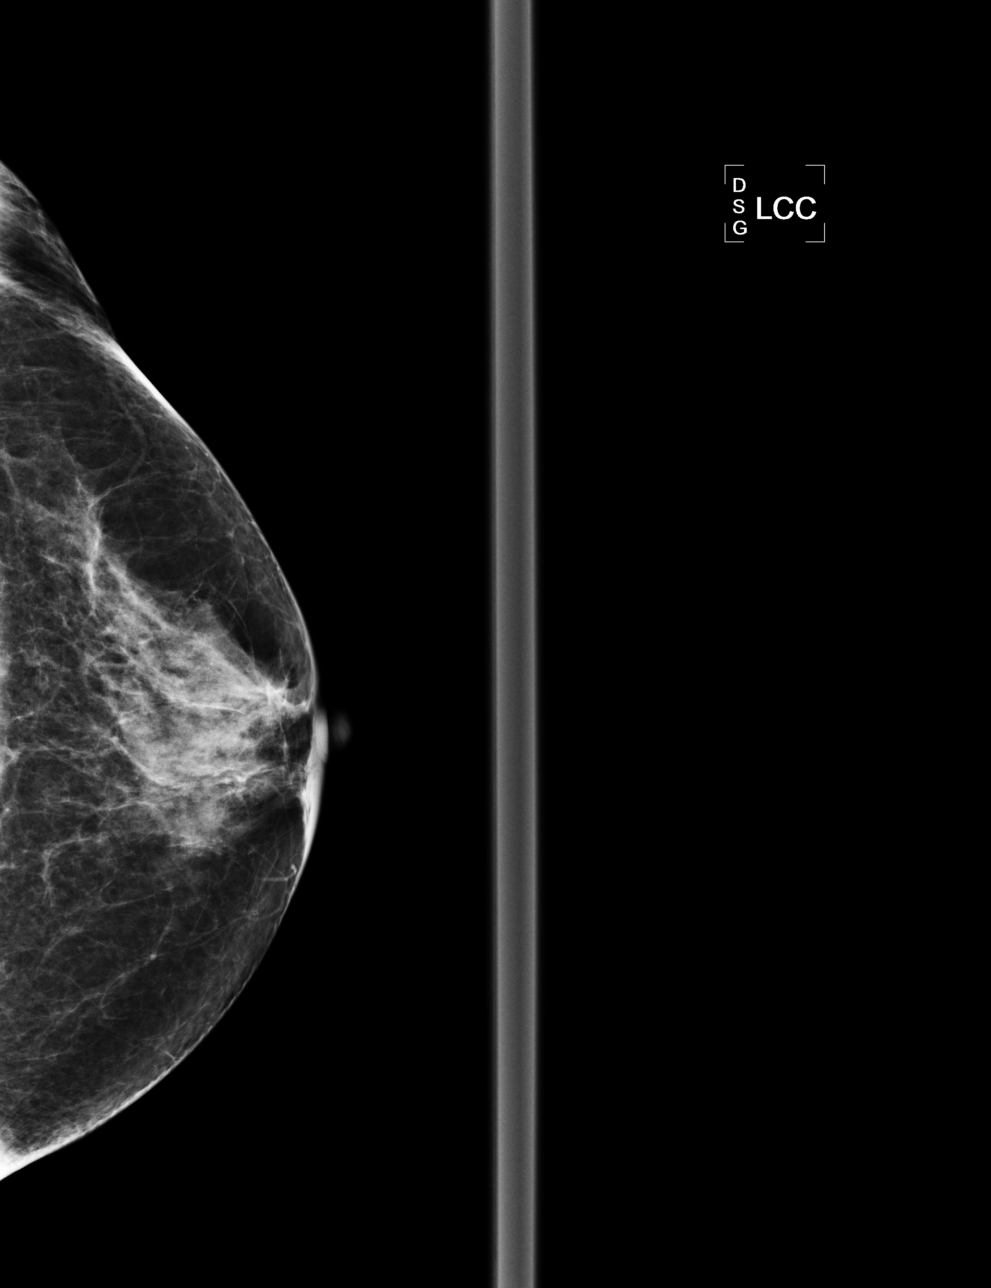

[L MLO]
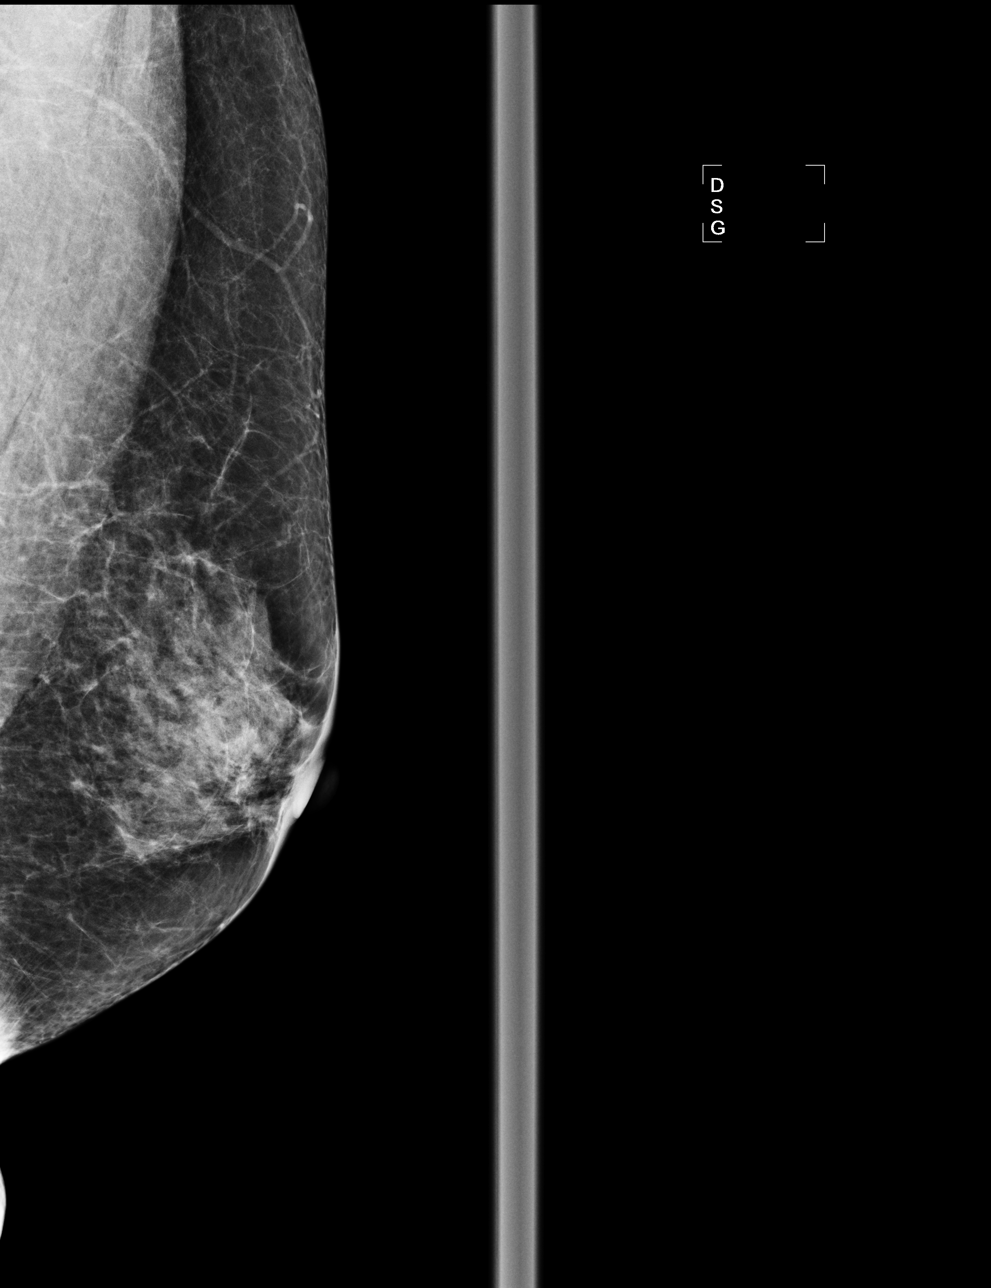

[4 of 4 positions shown; findings below may reference images not displayed]

FINDINGS: ACR Breast Density Category 3: The breast tissue is heterogeneously
dense.

In the right breast, calcifications warrant further evaluation with
magnified views.  In the left breast, no mass or malignant type
calcifications are identified.

Images were processed with CAD.
IMPRESSION: Further evaluation is suggested for calcifications in the right
breast.

RECOMMENDATION:
Diagnostic mammogram of the right breast. (Code:[id number])

BI-RADS CATEGORY 0:  Incomplete.  Need additional imaging
evaluation and/or prior mammograms for comparison.

## 2013-07-15 ENCOUNTER — Ambulatory Visit (INDEPENDENT_AMBULATORY_CARE_PROVIDER_SITE_OTHER): Payer: Medicaid Other | Admitting: Internal Medicine

## 2013-07-15 DIAGNOSIS — M4722 Other spondylosis with radiculopathy, cervical region: Secondary | ICD-10-CM

## 2013-07-15 DIAGNOSIS — M19019 Primary osteoarthritis, unspecified shoulder: Secondary | ICD-10-CM

## 2013-07-15 DIAGNOSIS — Z0000 Encounter for general adult medical examination without abnormal findings: Secondary | ICD-10-CM

## 2013-07-15 DIAGNOSIS — M12819 Other specific arthropathies, not elsewhere classified, unspecified shoulder: Secondary | ICD-10-CM

## 2013-07-15 DIAGNOSIS — M4712 Other spondylosis with myelopathy, cervical region: Secondary | ICD-10-CM

## 2013-07-15 LAB — LIPID PANEL
CHOL/HDL RATIO: 5.2 ratio
CHOLESTEROL: 240 mg/dL — AB (ref 0–200)
HDL: 46 mg/dL (ref >39)
LDL Cholesterol: 134 mg/dL — ABNORMAL HIGH (ref 0–99)
Triglycerides: 299 mg/dL — ABNORMAL HIGH (ref <150)
VLDL: 60 mg/dL — AB (ref 0–40)

## 2013-07-15 MED ORDER — IBUPROFEN 600 MG PO TABS: 600.0000 mg | ORAL_TABLET | Freq: Three times a day (TID) | ORAL | Status: DC | PRN

## 2013-07-15 NOTE — Assessment & Plan Note (Addendum)
She still has neck pain and "heaviness" of her arm at times.  She says the neck pain is right sided and feels muscular.  Heating pad and ibuprofen works for the pain.  Moving the arms relieves the heaviness.  Radial pulse intact, strength and sensation intact.  Plan to continue ibuprofen.  Referral to Sports Medicine.  Patient to return in 4-6 weeks.

## 2013-07-15 NOTE — Progress Notes (Signed)
   Subjective:    Patient ID: Dana Schultz, female    DOB: 1955-11-21, 58 y.o.   MRN: [id number]  HPI Comments: Dana Schultz is a 58 year old woman [place] woman with cervical spondylosis with radiculopathy, rotator cuff arthropathy and HLD who presents for routine follow-up, med refill and right neck and shoulder pain, especially with sitting for long periods.  She has brought a friend to interpret for her.  Right shoulder pain x 1 year and neck pain of recent onset (1 month).  Pain is 3-5/10 and comes and goes.  Worst with overhead reaching, sitting for long periods.  Alsosleeping and while doing chores around the house.  Arm sometimes feels "heavy" which resolves when she rubs it and moves it.  Also describes tightness but not numbness.  She reports occasional, brief right sided chest pain .  Denies dyspnea, vomiting, diaphoresis.  She was previously referred to Sports Medicine.     Review of Systems  Constitutional: Negative for fever, chills and fatigue.  Respiratory: Negative for shortness of breath.   Cardiovascular: Negative for chest pain.  Gastrointestinal: Positive for constipation. Negative for nausea, vomiting and diarrhea.  Endocrine: Negative for polydipsia and polyuria.  Genitourinary: Negative for dysuria.  Musculoskeletal: Positive for neck pain.       Shoulder pain.  Neurological: Positive for dizziness. Negative for headaches.       Objective:   Physical Exam  Vitals reviewed. Constitutional: She is oriented to person, place, and time. No distress.  HENT:  Mouth/Throat: Oropharynx is clear and moist. No oropharyngeal exudate.  Eyes: Pupils are equal, round, and reactive to light.  Cardiovascular: Normal rate, regular rhythm and normal heart sounds.   Pulmonary/Chest: Effort normal and breath sounds normal. No respiratory distress. She has no wheezes. She has no rales.  Abdominal: Soft. Bowel sounds are normal. She exhibits no distension. There is no tenderness.    Musculoskeletal: She exhibits no edema.  Right arm/shoulder - Negative painful arc, negative drop arm, negative internal rotation lag sign.  Right anterior shoulder TTP.  Tenderness of lateral arm.  Full ROM, strength and sensation intact.  No evidence of atrophy.  Spine non-tender.  Neurological: She is alert and oriented to person, place, and time.  Skin: Skin is warm. She is not diaphoretic.  Psychiatric: She has a normal mood and affect. Her behavior is normal.          Assessment & Plan:  Please see problem based assessment and plan.

## 2013-07-15 NOTE — Assessment & Plan Note (Signed)
She continues to have shoulder and arm pain.  Today's exam does not seem consistent with rotator cuff tear given negative drop arm, negative painful arc, negative internal rotation lag sign and full motor strength.  She does have anterior shoulder tenderness and lateral arm pain that is exacerbated by overhead movements which could represent biceps tendinitis.  Rotator cuff pathology is still in the differential.  She was previously referred to sports medicine but could not make the appointment.    Plan:  1) Ibuprofen for pain           2) Refer to Sports Medicine           3) Return to clinic in 4-6 weeks for follow-up

## 2013-07-15 NOTE — Patient Instructions (Signed)
1. You will be contacted with appointment date for Sports Medicine.  You can continue to take ibuprofen every 8 hours if needed and use heating pads if this helps.   2. Please take all medications as prescribed.    3. If you have worsening of your symptoms or new symptoms arise, please call the clinic ([phone]), or go to the ER immediately if symptoms are severe.  4. You will be contacted with appointment for mammogram.

## 2013-07-17 NOTE — Progress Notes (Signed)
Case discussed with Dr. Wilson at the time of the visit.  We reviewed the resident's history and exam and pertinent patient test results.  I agree with the assessment, diagnosis, and plan of care documented in the resident's note. 

## 2013-08-09 ENCOUNTER — Encounter: Payer: Self-pay | Admitting: Family Medicine

## 2013-08-09 ENCOUNTER — Ambulatory Visit (INDEPENDENT_AMBULATORY_CARE_PROVIDER_SITE_OTHER): Payer: Medicaid Other | Admitting: Family Medicine

## 2013-08-09 VITALS — BP 117/77 | HR 75 | Ht <= 58 in | Wt 104.0 lb

## 2013-08-09 DIAGNOSIS — M19019 Primary osteoarthritis, unspecified shoulder: Secondary | ICD-10-CM

## 2013-08-09 DIAGNOSIS — M7520 Bicipital tendinitis, unspecified shoulder: Secondary | ICD-10-CM

## 2013-08-09 DIAGNOSIS — M12819 Other specific arthropathies, not elsewhere classified, unspecified shoulder: Secondary | ICD-10-CM

## 2013-08-09 DIAGNOSIS — M7521 Bicipital tendinitis, right shoulder: Secondary | ICD-10-CM

## 2013-08-09 MED ORDER — METHYLPREDNISOLONE ACETATE 40 MG/ML IJ SUSP
40.0000 mg | Freq: Once | INTRAMUSCULAR | Status: AC
  Administered 2013-08-09: 40 mg via INTRA_ARTICULAR

## 2013-08-09 NOTE — Patient Instructions (Signed)
Thank you for coming in today You have biceps tendonitis You were given a shot that should help to decrease the inflammation and pain in your arm.  Please continue doing your exercises Please come back in 4 weeks if you are no better

## 2013-08-09 NOTE — Assessment & Plan Note (Signed)
1) R Biceps tendonitis: [place] w/ signs of inflammation around the tendon and at insertion site. - Steroid injections today into the sheath w/ immediate relief - exercises given - f/u in 4 wks if needed  Mild rotator cuff tendonitis symptoms primarily involving supraspinatus. Rehab exercises likely to benefit pt.

## 2013-08-09 NOTE — Progress Notes (Signed)
Dana Schultz is a 58 y.o. female who presents to [facility] today for R shoulder pain  [place] pain: R side. Started several years ago. Denies trauma. No popping or catching. Improves w/ "Asian" heat pads. No change in overall status. Worse w/ increased movement. Ibuprofen 600 w/ some benefit. Occasional numbness and tingling down arm to hand. Pt has also tried formal PT in 2013 which provided relief but symptoms returned. Still doing daily exercises.    PMH reviewed.  F[place]eman as above otherwise neg Medications reviewed.  Exam:  BP 117/77  Pulse 75  Ht 4' 6.5" (1.384 m)  Wt 104 lb (47.174 kg)  BMI 24.63 kg/m2 Gen: Well NAD MSK: Bilat shoulders FROM but slow secondary to pain. ttp along the Biceps tendon. Hawkins nml, Empty can mildly painful on R. Lift off nml. Spurlings neg  No results found.  A steroid injection was performed at the R biceps tendon sheath using 1% plain Lidocaine and 20mg  of Depomedrol.  This was well tolerated.    Assessment and Plan: 1) R Biceps tendonitis: [place] w/ signs of inflammation around the tendon and at insertion site. - Steroid injections today into the sheath w/ immediate relief - exercises given - f/u in 4 wks if needed  2) Also w/ some mild rotator cuff tendonitis primarily involving the supraspinatus - Exercises given - pt already w/ NSAIDs on board.   Linna Darner, MD Family Medicine PGY-3 08/09/2013, 4:26 PM

## 2013-08-26 ENCOUNTER — Ambulatory Visit: Payer: Medicaid Other | Admitting: Internal Medicine

## 2013-09-09 ENCOUNTER — Encounter: Payer: Self-pay | Admitting: Internal Medicine

## 2013-09-09 ENCOUNTER — Ambulatory Visit (INDEPENDENT_AMBULATORY_CARE_PROVIDER_SITE_OTHER): Payer: Medicaid Other | Admitting: Internal Medicine

## 2013-09-09 VITALS — BP 106/70 | HR 80 | Temp 97.1°F | Ht <= 58 in | Wt 104.6 lb

## 2013-09-09 DIAGNOSIS — M4722 Other spondylosis with radiculopathy, cervical region: Secondary | ICD-10-CM

## 2013-09-09 DIAGNOSIS — E785 Hyperlipidemia, unspecified: Secondary | ICD-10-CM

## 2013-09-09 DIAGNOSIS — M4712 Other spondylosis with myelopathy, cervical region: Secondary | ICD-10-CM

## 2013-09-09 MED ORDER — DICLOFENAC SODIUM 1 % TD GEL: 2.0000 g | Freq: Four times a day (QID) | TRANSDERMAL | Status: DC

## 2013-09-09 NOTE — Assessment & Plan Note (Signed)
Last LDL 136 which is fine given her low risk profile. Recheck in 5 years from last.

## 2013-09-09 NOTE — Progress Notes (Signed)
Subjective:     Patient ID: Dana Schultz, female   DOB: 1955/09/26, 58 y.o.   MRN: [id number]  HPI The patient is a 58 YO female who is coming in to the clinic for a follow up of her neck pain. She has mild hyperlipidemia, rotator cuff problems, mild acid reflux. She speaks Khmer and is from [place] and her friend is with her to interpret. She is not having any problems today. She is feeling better with her right shoulder pain although it still hurts every once in a while. She is not having chest pain or abdominal pain or swelling. She has no nausea, vomiting, SOB.   Review of Systems  Constitutional: Negative for fever, chills, diaphoresis, activity change, appetite change, fatigue and unexpected weight change.  Respiratory: Negative for cough, chest tightness, shortness of breath and wheezing.   Cardiovascular: Negative for chest pain, palpitations and leg swelling.  Gastrointestinal: Negative for nausea, vomiting, abdominal pain, diarrhea, constipation and abdominal distention.  Musculoskeletal: Positive for arthralgias. Negative for back pain, gait problem, joint swelling, myalgias, neck pain and neck stiffness.  Skin: Negative for color change, pallor, rash and wound.  Neurological: Negative for dizziness, tremors, seizures, syncope, facial asymmetry, speech difficulty, weakness, light-headedness, numbness and headaches.       Objective:   Physical Exam  Constitutional: She is oriented to person, place, and time. She appears well-developed and well-nourished. No distress.  HENT:  Head: Normocephalic and atraumatic.  Eyes: EOM are normal. Pupils are equal, round, and reactive to light.  Neck: Neck supple. No JVD present. No tracheal deviation present. No thyromegaly present.  Cardiovascular: Normal rate and regular rhythm.   No murmur heard. Pulmonary/Chest: Effort normal and breath sounds normal. No respiratory distress. She has no wheezes. She has no rales. She exhibits no tenderness.   Abdominal: Soft. Bowel sounds are normal. She exhibits no distension. There is no tenderness. There is no rebound.  Musculoskeletal: Normal range of motion. She exhibits tenderness. She exhibits no edema.  Right shoulder with pain on resisted ROM but no pain with passive ROM.  Lymphadenopathy:    She has no cervical adenopathy.  Neurological: She is alert and oriented to person, place, and time. No cranial nerve deficit.  Skin: Skin is warm and dry. No rash noted. She is not diaphoretic. No erythema. No pallor.       Assessment:   1. Please see problem oriented charting.  2. Disposition - Patient will return in 6-12 months or sooner if feeling sick. Voltaren gel prescribed to try on her shoulder if needed.

## 2013-09-09 NOTE — Patient Instructions (Addendum)
?????????????????????????????????????????????????????????????   voltaren ???? ???????????????????????????????????? 3 ???????????????  ????????????????????? 6-12 ????????????????????  ?????????????????????????????????????? [phone] ?    We will give you a medicine that will help with the shoulder pain called voltaren gel. Rub it where the pain is up to 3 times per day.  We will see you back in about 6-12 months for a check up.  Call us with any problems before then at [phone].

## 2013-09-09 NOTE — Assessment & Plan Note (Signed)
She is doing better after the injection but does have occasional pain. Will rx voltaren gel to help with pain in her shoulder. She will follow with sports medicine.

## 2013-09-13 NOTE — Progress Notes (Signed)
Case discussed with Dr. Kollar soon after the resident saw the patient.  We reviewed the resident's history and exam and pertinent patient test results.  I agree with the assessment, diagnosis, and plan of care documented in the resident's note. 

## 2013-10-12 ENCOUNTER — Ambulatory Visit: Payer: Medicaid Other | Admitting: Internal Medicine

## 2014-04-11 ENCOUNTER — Encounter: Payer: Self-pay | Admitting: Internal Medicine

## 2014-04-11 ENCOUNTER — Ambulatory Visit (INDEPENDENT_AMBULATORY_CARE_PROVIDER_SITE_OTHER): Payer: Medicaid Other | Admitting: Internal Medicine

## 2014-04-11 VITALS — BP 133/70 | HR 68 | Temp 97.6°F | Ht <= 58 in | Wt 102.0 lb

## 2014-04-11 DIAGNOSIS — M779 Enthesopathy, unspecified: Secondary | ICD-10-CM

## 2014-04-11 DIAGNOSIS — Z0000 Encounter for general adult medical examination without abnormal findings: Secondary | ICD-10-CM | POA: Insufficient documentation

## 2014-04-11 DIAGNOSIS — M778 Other enthesopathies, not elsewhere classified: Secondary | ICD-10-CM | POA: Insufficient documentation

## 2014-04-11 DIAGNOSIS — M6588 Other synovitis and tenosynovitis, other site: Secondary | ICD-10-CM

## 2014-04-11 DIAGNOSIS — M7581 Other shoulder lesions, right shoulder: Secondary | ICD-10-CM

## 2014-04-11 DIAGNOSIS — M67442 Ganglion, left hand: Secondary | ICD-10-CM | POA: Insufficient documentation

## 2014-04-11 MED ORDER — IBUPROFEN 800 MG PO TABS: 800.0000 mg | ORAL_TABLET | Freq: Three times a day (TID) | ORAL | Status: DC | PRN

## 2014-04-11 MED ORDER — DICLOFENAC SODIUM 1 % TD GEL: 2.0000 g | Freq: Four times a day (QID) | TRANSDERMAL | Status: DC

## 2014-04-11 NOTE — Progress Notes (Signed)
Subjective:   Patient ID: Dana Schultz female   DOB: 03-17-1956 58 y.o.   MRN: [id number]  HPI: [name] is a 58 y.o. [place] female who presents to clinic for acute visit. She does not speak [place] and is here with a Dana Schultz.   R shoulder pain: chronic but recent worsening. Limited ability to lift arm from her side and behind her back due to pain. No swelling, erythema, or trauma to area. She endorses some relief with ibuprofen (600mg  q8h prn and also has been taking otc advil at times).  Additionally, she is using voltaren gel that helps as well and some ointments from the [employer] market and pain patches she sticks to the area.  Finally, she has tried PT in the past that she said helped a lot and tries to do some of the exercises she was taught.   L forearm pain--radiating to left index finger x1-2 weeks. Denies trauma to area. Pain mainly on extension of finger.    Past Medical History  Diagnosis Date  . Mild acid reflux 05/01/2012  . Cervical spondylosis with radiculopathy 05/01/2012  . Rotator cuff arthropathy 05/01/2012  . Cystitis 05/01/2012   Current Outpatient Prescriptions  Medication Sig Dispense Refill  . diclofenac sodium (VOLTAREN) 1 % GEL Apply 2 g topically 4 (four) times daily.  100 g  3  . ibuprofen (ADVIL,MOTRIN) 800 MG tablet Take 1 tablet (800 mg total) by mouth every 8 (eight) hours as needed.  60 tablet  0   No current facility-administered medications for this visit.   Family History  Problem Relation Age of Onset  . Cancer Neg Hx   . Heart disease Neg Hx   . Stroke Neg Hx   . Colon cancer Neg Hx   . Stomach cancer Neg Hx    History   Social History  . Marital Status: Single    Spouse Name: N/A    Number of Children: 0  . Years of Education: N/A   Social History Main Topics  . Smoking status: Never Smoker   . Smokeless tobacco: Never Used  . Alcohol Use: No  . Drug Use: No  . Sexual Activity: Not Currently    Partners: Male   Other  Topics Concern  . None   Social History Narrative   Immigrated to [place] in [place] from [place].  Lived in [place] until about 4 years ago when she moved to [place].  Lives with her niece.   Review of Systems:  Constitutional:  Denies fever, chills  HEENT:  Neck pain   Gastrointestinal:  Denies nausea, vomiting, abdominal pain  Genitourinary:  Denies dysuria  Musculoskeletal:  R arm pain and L forearm pain extending to left index finger  Skin:  Denies pallor, rash and wound.   Neurological:  Denies headaches.    Objective:  Physical Exam: Filed Vitals:   04/11/14 1425  BP: 133/70  Pulse: 68  Temp: 97.6 F (36.4 C)  TempSrc: Oral  Height: 4\' 5"  (1.346 m)  Weight: 102 lb (46.267 kg)  SpO2: 98%   Vitals reviewed. General: sitting in chair, NAD HEENT: EOMI Cardiac: RRR Pulm: clear to auscultation bilaterally, no wheezes, rales, or rhonchi Abd: soft, nontender, nondistended, BS present Ext: warm and well perfused, moving all 4 extremities. RUE limited ROM on abduction due to pain but able to raise arms above head slowly. Difficult to rotation right arm behind back also due to pain. Mild tenderness to palpation of shoulder  anterior and lateral portion but no edema or erythema. Flexion and extension of all extremities in tact. Heberden's nodes on b/l hands with small non-tender ganglion cyst of left index finger. L anterior forearm pain extending to left index finger mainly with extension Neuro: alert and oriented X3, strength and sensation to light touch equal in bilateral upper and lower extremities  Assessment & Plan:  Discussed with Dr. Eppie Gibson

## 2014-04-11 NOTE — Patient Instructions (Signed)
General Instructions:  Thank you for bringing your medicines today. This helps [place] keep you safe from mistakes.  Ibuprofen 800mg  three times a day Progress Toward Treatment Goals:  No flowsheet data found.  Self Care Goals & Plans:  No flowsheet data found.  No flowsheet data found.   Care Management & Community Referrals:  Referral 06/05/2012  Referrals made to community resources exercise/physical therapy

## 2014-04-11 NOTE — Assessment & Plan Note (Signed)
Will return for flu vaccine, her son who is her ride needed to leave for his children.  Refer for mammogram next visit as well zostavax if affordable

## 2014-04-11 NOTE — Assessment & Plan Note (Addendum)
Pain mainly on extension of left index finger.   -same management as tendinitis of right shoulder -ibuprofen 800mg  q8h prn

## 2014-04-11 NOTE — Assessment & Plan Note (Signed)
Index finger. Non-tender to palpation.   -will observe for now -if becomes symptomatic with pain and bothersome to patient, could consider aspration

## 2014-04-11 NOTE — Assessment & Plan Note (Addendum)
Limited ROM due to pain, likely infraspinatus affected.   Offered steroid injection vs. Trying stronger NSAID therapy. She favored trying NSAID's for now but if no improvement will return for possible injection.  -increased ibuprofen from 600mg  q8h to 800mg  q8h prn for ~3 weeks -refilled voltaren gel per her request -she will continue to use her asian ointments since the seem to be helping and continue PT exercises -RTC 2-3 weeks or sooner if no improvement

## 2014-04-12 NOTE — Progress Notes (Signed)
I saw and evaluated the patient. I personally confirmed the key portions of Dr. Qureshi's history and exam and reviewed pertinent patient test results. The assessment, diagnosis, and plan were formulated together and I agree with the documentation in the resident's note. 

## 2014-05-11 ENCOUNTER — Encounter: Payer: Medicaid Other | Admitting: Internal Medicine

## 2014-05-17 ENCOUNTER — Encounter: Payer: Self-pay | Admitting: Internal Medicine

## 2014-05-17 ENCOUNTER — Ambulatory Visit (INDEPENDENT_AMBULATORY_CARE_PROVIDER_SITE_OTHER): Payer: Medicaid Other | Admitting: Internal Medicine

## 2014-05-17 VITALS — BP 132/89 | HR 76 | Temp 97.5°F | Wt 102.2 lb

## 2014-05-17 DIAGNOSIS — M778 Other enthesopathies, not elsewhere classified: Secondary | ICD-10-CM

## 2014-05-17 DIAGNOSIS — M7581 Other shoulder lesions, right shoulder: Secondary | ICD-10-CM

## 2014-05-17 NOTE — Progress Notes (Signed)
Patient ID: LOLETA FROMMELT, female   DOB: 05-29-1956, 58 y.o.   MRN: [id number]  Subjective:   Patient ID: LASHAWNDRA LAMPKINS female   DOB: Feb 27, 1956 58 y.o.   MRN: [id number]  HPI: Ms.Aminat N Tayag is a 58 y.o. F w/ PMH chronic pain who presents to the clinic for a routine f/u for her tendonitis.  She was last seen in the clinic 04/11/14 for R shoulder and left hand, which was thought to be tendonitis. Her Ibuprofen was increased to 800mg  q8h x3 weeks, she was to apply Voltaren gel to the sites, and she was given physical therapy exercises to try.   Today her pain is doing better. She is now only taking the ibuprofen as needed for pain, and probably takes 1 800mg  tablet a day. She uses the Voltaren gel as needed as well.    Past Medical History  Diagnosis Date  . Mild acid reflux 05/01/2012  . Cervical spondylosis with radiculopathy 05/01/2012  . Rotator cuff arthropathy 05/01/2012  . Cystitis 05/01/2012   Current Outpatient Prescriptions  Medication Sig Dispense Refill  . diclofenac sodium (VOLTAREN) 1 % GEL Apply 2 g topically 4 (four) times daily. 100 g 3  . ibuprofen (ADVIL,MOTRIN) 800 MG tablet Take 1 tablet (800 mg total) by mouth every 8 (eight) hours as needed. 60 tablet 0   No current facility-administered medications for this visit.   Family History  Problem Relation Age of Onset  . Cancer Neg Hx   . Heart disease Neg Hx   . Stroke Neg Hx   . Colon cancer Neg Hx   . Stomach cancer Neg Hx    History   Social History  . Marital Status: Single    Spouse Name: N/A    Number of Children: 0  . Years of Education: N/A   Social History Main Topics  . Smoking status: Never Smoker   . Smokeless tobacco: Never Used  . Alcohol Use: No  . Drug Use: No  . Sexual Activity:    Partners: Male   Other Topics Concern  . None   Social History Narrative   Immigrated to [place] in [place] from [place].  Lived in [place] until about 4 years ago when she moved to [place].   Lives with her niece.   Review of Systems: Constitutional: Denies fever, chills Respiratory: Denies SOB, DOE Cardiovascular: Denies chest pain Gastrointestinal: Denies nausea, vomiting, abdominal pain Genitourinary: Denies dysuria, urgency, frequency Musculoskeletal: Shoulder pain improved Skin: Denies wound.  Neurological: Denies dizziness, syncope, weakness Psychiatric/Behavioral: Denies mood changes, confusion.  Objective:  Physical Exam: Filed Vitals:   05/17/14 1555  BP: 132/89  Pulse: 76  Temp: 97.5 F (36.4 C)  TempSrc: Oral  Weight: 102 lb 3.2 oz (46.358 kg)  SpO2: 97%   Constitutional: Vital signs reviewed.  Patient is a well-developed and well-nourished female in no acute distress and cooperative with exam. Alert and oriented x3.  Head: Normocephalic and atraumatic Eyes: PERRL, EOMI Cardiovascular: RRR, no MRG, pulses symmetric and intact bilaterally Pulmonary/Chest: Normal respiratory effort, CTAB, no wheezes, rales, or rhonchi Abdominal: Soft. Non-tender, non-distended, bowel sounds are normal Musculoskeletal: No joint deformities, erythema, or stiffness, ROM full and no nontender Neurological: A&O x3, Strength is normal and symmetric bilaterally, cranial nerve II-XII are grossly intact, no focal motor deficit Skin: Warm, dry and intact.   Psychiatric: Normal mood and affect. speech and behavior is normal.   Assessment & Plan:   Please refer to Problem List  based Assessment and Plan

## 2014-05-17 NOTE — Assessment & Plan Note (Addendum)
Pain has greatly improved with stretching and ibuprofen. She is now only using the ibuprofen PRN, maybe 1x day and stretches when the pain returns. She uses the Voltaren gel PRN as well. On exam, she has full ROM of her shoulders. Through an interpreter, I advised her to cut the ibuprofen in half and take only half a tablet as needed for pain now that the pain is less severe. This was also told to her family member who speaks [place]. I also advised her to continue to do the stretching exercises when she has pain.

## 2014-05-17 NOTE — Patient Instructions (Addendum)
**  You can cut the ibuprofen in half and take 1/2 tablet every 8 hours as needed for pain.   General Instructions:   Thank you for bringing your medicines today. This helps [place] keep you safe from mistakes.   Progress Toward Treatment Goals:  No flowsheet data found.  Self Care Goals & Plans:  No flowsheet data found.  No flowsheet data found.   Care Management & Community Referrals:  Referral 06/05/2012  Referrals made to community resources exercise/physical therapy

## 2014-05-18 NOTE — Progress Notes (Signed)
INTERNAL MEDICINE TEACHING ATTENDING ADDENDUM - Alaiza Yau, MD: I reviewed and discussed at the time of visit with the resident Dr. Glenn, the patient's medical history, physical examination, diagnosis and results of pertinent tests and treatment and I agree with the patient's care as documented.  

## 2014-05-30 ENCOUNTER — Ambulatory Visit: Payer: Medicaid Other

## 2014-06-20 ENCOUNTER — Ambulatory Visit: Payer: Medicaid Other

## 2014-06-22 ENCOUNTER — Ambulatory Visit: Payer: Medicaid Other

## 2014-06-22 ENCOUNTER — Other Ambulatory Visit: Payer: Self-pay | Admitting: Internal Medicine

## 2014-06-22 ENCOUNTER — Ambulatory Visit
Admission: RE | Admit: 2014-06-22 | Discharge: 2014-06-22 | Disposition: A | Discharge status: Home / Self Care | Payer: Medicaid Other | Source: Ambulatory Visit | Attending: Internal Medicine | Admitting: Internal Medicine

## 2014-06-22 DIAGNOSIS — Z1231 Encounter for screening mammogram for malignant neoplasm of breast: Secondary | ICD-10-CM

## 2014-06-28 ENCOUNTER — Ambulatory Visit (INDEPENDENT_AMBULATORY_CARE_PROVIDER_SITE_OTHER): Payer: Medicaid Other | Admitting: Internal Medicine

## 2014-06-28 ENCOUNTER — Encounter: Payer: Self-pay | Admitting: Internal Medicine

## 2014-06-28 VITALS — BP 132/70 | HR 73 | Temp 97.8°F | Ht <= 58 in | Wt 102.0 lb

## 2014-06-28 DIAGNOSIS — M7522 Bicipital tendinitis, left shoulder: Secondary | ICD-10-CM | POA: Insufficient documentation

## 2014-06-28 LAB — BASIC METABOLIC PANEL WITH GFR
BUN: 15 mg/dL (ref 6–23)
CALCIUM: 9.6 mg/dL (ref 8.4–10.5)
CHLORIDE: 101 meq/L (ref 96–112)
CO2: 31 meq/L (ref 19–32)
Creat: 0.92 mg/dL (ref 0.50–1.10)
GFR, Est African American: 78 mL/min
GFR, Est Non African American: 68 mL/min
Glucose, Bld: 71 mg/dL (ref 70–99)
Potassium: 4.8 mEq/L (ref 3.5–5.3)
SODIUM: 140 meq/L (ref 135–145)

## 2014-06-28 MED ORDER — IBUPROFEN 800 MG PO TABS: 800.0000 mg | ORAL_TABLET | Freq: Three times a day (TID) | ORAL | Status: DC | PRN

## 2014-06-28 NOTE — Patient Instructions (Signed)
General Instructions: I want you to resume taking Ibuprofen 800mg  TID for your pain.    Please follow up if not better in 2 weeks.  Please bring your medicines with you each time you come to clinic.  Medicines may include prescription medications, over-the-counter medications, herbal remedies, eye drops, vitamins, or other pills.   Progress Toward Treatment Goals:  No flowsheet data found.  Self Care Goals & Plans:  No flowsheet data found.  No flowsheet data found.   Care Management & Community Referrals:  Referral 06/05/2012  Referrals made to community resources exercise/physical therapy

## 2014-06-28 NOTE — Assessment & Plan Note (Signed)
-  Suspect Left bicipital tendonitis. Will treat conservatively with Ibuprofen 800mg  TID, will check BMP for renal function.  Does not want referral to sports med or shoulder injection at this time. - No bony tenderness and given fall was >1 month ago will hold off on Xrays.

## 2014-06-28 NOTE — Progress Notes (Signed)
[facility] Subjective:   Patient ID: Dana Schultz female   DOB: Oct 22, 1955 59 y.o.   MRN: [id number]  HPI: [name] is a 59 y.o. [place] female with a PMH of right rotator cuff tendonitis who presents for left shoulder pain.  We communicate through an interpreter. She notes that her right shoulder pain and limitations of motion is greatly improved and that she has stopped taking ibuprofen. However 1 month ago she had a mechanical fall and landed on her shoulder.  She notes that the pain is located on her left anterior aspect of her shoulder, its worse with lifting her arm which she has had great diffuclty doing.  She does not have any pain when it is at rest.  The pain does not radiate.  She has not tried any medications for her pain.    Past Medical History  Diagnosis Date  . Mild acid reflux 05/01/2012  . Cervical spondylosis with radiculopathy 05/01/2012  . Rotator cuff arthropathy 05/01/2012  . Cystitis 05/01/2012   Current Outpatient Prescriptions  Medication Sig Dispense Refill  . diclofenac sodium (VOLTAREN) 1 % GEL Apply 2 g topically 4 (four) times daily. 100 g 3  . ibuprofen (ADVIL,MOTRIN) 800 MG tablet Take 1 tablet (800 mg total) by mouth every 8 (eight) hours as needed. 60 tablet 0   No current facility-administered medications for this visit.   Family History  Problem Relation Age of Onset  . Cancer Neg Hx   . Heart disease Neg Hx   . Stroke Neg Hx   . Colon cancer Neg Hx   . Stomach cancer Neg Hx    History   Social History  . Marital Status: Single    Spouse Name: N/A    Number of Children: 0  . Years of Education: N/A   Social History Main Topics  . Smoking status: Never Smoker   . Smokeless tobacco: Never Used  . Alcohol Use: No  . Drug Use: No  . Sexual Activity:    Partners: Male   Other Topics Concern  . None   Social History Narrative   Immigrated to [place] in [place] from [place].  Lived in [place] until  about 4 years ago when she moved to [place].  Lives with her niece.   Review of Systems: Review of Systems  Constitutional: Negative for fever, chills, weight loss and malaise/fatigue.  Eyes: Negative for blurred vision.  Respiratory: Negative for cough and shortness of breath.   Cardiovascular: Negative for chest pain.  Gastrointestinal: Negative for abdominal pain.  Genitourinary: Negative for dysuria.  Musculoskeletal: Positive for joint pain.  Skin: Negative for rash.  Neurological: Negative for weakness.    Objective:  Physical Exam: Filed Vitals:   06/28/14 1458  BP: 132/70  Pulse: 73  Temp: 97.8 F (36.6 C)  TempSrc: Oral  Height: 4' 7.8" (1.417 m)  Weight: 102 lb (46.267 kg)  SpO2: 100%  Physical Exam  Constitutional: She is well-developed, well-nourished, and in no distress.  Cardiovascular: Normal rate, regular rhythm and normal heart sounds.   Pulmonary/Chest: Effort normal and breath sounds normal.  Musculoskeletal:       Left shoulder: She exhibits decreased range of motion and tenderness (bicipital grove). She exhibits no bony tenderness, no swelling, no effusion, no crepitus and no spasm.  Unable to abduct left shoulder past 30 degrees, very limited back scratch of left arm, negative apprehension test, no sulcus sign  Nursing note and vitals  reviewed.   Assessment & Plan:  Case discussed with Dr. Dareen Piano  Left bicipital tenosynovitis -Suspect Left bicipital tendonitis. Will treat conservatively with Ibuprofen 800mg  TID, will check BMP for renal function.  Does not want referral to sports med or shoulder injection at this time. - No bony tenderness and given fall was >1 month ago will hold off on Xrays.    Medications Ordered Meds ordered this encounter  Medications  . ibuprofen (ADVIL,MOTRIN) 800 MG tablet    Sig: Take 1 tablet (800 mg total) by mouth every 8 (eight) hours as needed.    Dispense:  60 tablet    Refill:  0   Other  Orders Orders Placed This Encounter  Procedures  . BMP with Estimated GFR ([id number])

## 2014-06-29 NOTE — Progress Notes (Signed)
INTERNAL MEDICINE TEACHING ATTENDING ADDENDUM - Tomi Grandpre, MD: I reviewed and discussed at the time of visit with the resident Dr. Hoffman, the patient's medical history, physical examination, diagnosis and results of pertinent tests and treatment and I agree with the patient's care as documented.  

## 2014-08-04 ENCOUNTER — Ambulatory Visit (HOSPITAL_COMMUNITY)
Admission: RE | Admit: 2014-08-04 | Discharge: 2014-08-04 | Disposition: A | Discharge status: Home / Self Care | Payer: Medicaid Other | Source: Ambulatory Visit | Attending: Internal Medicine | Admitting: Internal Medicine

## 2014-08-04 ENCOUNTER — Ambulatory Visit (INDEPENDENT_AMBULATORY_CARE_PROVIDER_SITE_OTHER): Payer: Medicaid Other | Admitting: Internal Medicine

## 2014-08-04 VITALS — BP 130/62 | HR 79 | Temp 97.9°F | Ht <= 58 in | Wt 104.3 lb

## 2014-08-04 DIAGNOSIS — Z0000 Encounter for general adult medical examination without abnormal findings: Secondary | ICD-10-CM

## 2014-08-04 DIAGNOSIS — W19XXXA Unspecified fall, initial encounter: Secondary | ICD-10-CM | POA: Diagnosis not present

## 2014-08-04 DIAGNOSIS — M7522 Bicipital tendinitis, left shoulder: Secondary | ICD-10-CM

## 2014-08-04 DIAGNOSIS — Z23 Encounter for immunization: Secondary | ICD-10-CM

## 2014-08-04 DIAGNOSIS — E785 Hyperlipidemia, unspecified: Secondary | ICD-10-CM

## 2014-08-04 DIAGNOSIS — R1030 Lower abdominal pain, unspecified: Secondary | ICD-10-CM

## 2014-08-04 DIAGNOSIS — M25512 Pain in left shoulder: Secondary | ICD-10-CM | POA: Insufficient documentation

## 2014-08-04 LAB — POCT URINALYSIS DIPSTICK
BILIRUBIN UA: NEGATIVE
Blood, UA: NEGATIVE
Glucose, UA: NEGATIVE
KETONES UA: NEGATIVE
Nitrite, UA: NEGATIVE
Protein, UA: NEGATIVE
Spec Grav, UA: 1.01
Urobilinogen, UA: 0.2
pH, UA: 5.5

## 2014-08-04 NOTE — Patient Instructions (Addendum)
Please continue taking your ibuprofen and Voltaren gel to help with your shoulder pain. We have ordered a shoulder x-ray. I will call you with any abnormal results. Additionally, I recommend that you go to the sports medicine specialist to get further management of your shoulder pain.   General Instructions:   Thank you for bringing your medicines today. This helps [place] keep you safe from mistakes.   Progress Toward Treatment Goals:  No flowsheet data found.  Self Care Goals & Plans:  Self Care Goal 08/04/2014  Manage my medications take my medicines as prescribed; bring my medications to every visit  Eat healthy foods drink diet soda or water instead of juice or soda; eat more vegetables; eat foods that are low in salt; eat baked foods instead of fried foods; eat fruit for snacks and desserts    No flowsheet data found.   Care Management & Community Referrals:  Referral 06/05/2012  Referrals made to community resources exercise/physical therapy

## 2014-08-04 NOTE — Assessment & Plan Note (Signed)
Patient's last lipid panel from 2015 as below. There has been discussion in the chart about adding a statin to her therapy though patient has in the past refused. It seems that patient's ASCVD 10 year risk profile is 3% which does not warrant statin therapy (even though her lipids may be elevated).  -Will hold off on initiating statin therapy at this point.   Lipid Panel     Component Value Date/Time   CHOL 240* 07/15/2013 1419   TRIG 299* 07/15/2013 1419   HDL 46 07/15/2013 1419   CHOLHDL 5.2 07/15/2013 1419   VLDL 60* 07/15/2013 1419   LDLCALC 134* 07/15/2013 1419

## 2014-08-04 NOTE — Assessment & Plan Note (Addendum)
Patient continues to report symptoms that are consistent with left bicipital tendinitis. Today, patient is particularly concerned about an injury that she may have suffered after falling on her arm several weeks ago which precipitated the symptoms. She states that her left shoulder hurts anteriorly especially with extension and abduction. Her range of motion in both directions is limited to less than 90. She states that this pain is worse at night than during the day. She previously managed conservatively after an appointment from 06/28/2014 with ibuprofen 800 mg 3 times a day. A shoulder x-ray was not performed at that point. On exam today, patient does not have significant tenderness though has limited range of active and passive motion to less than 90. In the past, patient has been evaluated for right-sided biceps tendinitis by sports medicine but has not returned to the clinic. -Left shoulder x-ray -Scheduled patient to follow-up with sports medicine.

## 2014-08-04 NOTE — Assessment & Plan Note (Signed)
Flu vaccine administered today.

## 2014-08-04 NOTE — Progress Notes (Signed)
Medicine attending: Medical history, presenting problems, physical findings, and medications, reviewed with Dr Luan Moore and I concur with his evaluation and management plan.

## 2014-08-04 NOTE — Progress Notes (Addendum)
   Subjective:    Patient ID: Marcelle Overlie, female    DOB: 08-24-1955, 59 y.o.   MRN: [id number]  HPI  Patient is a 59 year old female with a history of right and left bicipital tendinitis who presents to clinic for persistent left biceps tendinitis.  Of note, patient initially reporting dysuria for which a urine dipstick was sent. This was unremarkable for signs of urinary tract infection. Additionally, upon further clarification, patient stated that she initially had dysuria though her symptoms have resolved over the last couple days.  Please see problem based charting for more details.  Review of Systems  Constitutional: Negative for fever and chills.  HENT: Negative for rhinorrhea and sore throat.   Eyes: Negative for visual disturbance.  Respiratory: Negative for cough and shortness of breath.   Cardiovascular: Negative for chest pain and palpitations.  Gastrointestinal: Negative for nausea, vomiting, abdominal pain, diarrhea, constipation and blood in stool.  Genitourinary: Negative for dysuria and hematuria.  Musculoskeletal:       Left shoulder pain with limited range of motion  Neurological: Negative for syncope.       Objective:   Physical Exam  Constitutional: She is oriented to person, place, and time. She appears well-developed and well-nourished. No distress.  HENT:  Head: Normocephalic and atraumatic.  Eyes: EOM are normal. Pupils are equal, round, and reactive to light. Left eye exhibits no discharge.  Neck: Normal range of motion. Neck supple. No thyromegaly present.  Cardiovascular: Normal rate and regular rhythm.  Exam reveals no gallop and no friction rub.   No murmur heard. Pulmonary/Chest: Effort normal and breath sounds normal. No respiratory distress. She has no wheezes. She has no rales.  Abdominal: Soft. Bowel sounds are normal. She exhibits no distension. There is no tenderness. There is no rebound.  Musculoskeletal: She exhibits no edema.  No bony  abnormalities visualized. No significant tenderness upon palpation throughout the left shoulder joint. Limited active and passive range of motion less than 90 with both abduction and extension.  Neurological: She is alert and oriented to person, place, and time. No cranial nerve deficit.  Skin: Skin is warm and dry. No rash noted.  Psychiatric: She has a normal mood and affect. Thought content normal.          Assessment & Plan:  Please see problem based charting for more details.

## 2014-08-05 ENCOUNTER — Telehealth: Payer: Self-pay | Admitting: Internal Medicine

## 2014-08-05 DIAGNOSIS — M7522 Bicipital tendinitis, left shoulder: Secondary | ICD-10-CM

## 2014-08-05 NOTE — Assessment & Plan Note (Signed)
Shoulder x-ray showing no evidence of fracture, subluxation, dislocation, or soft tissue abnormality.

## 2014-08-05 NOTE — Telephone Encounter (Signed)
Patient called and family member notified of normal shoulder x-ray interpretation.

## 2014-08-12 ENCOUNTER — Ambulatory Visit: Payer: Medicaid Other | Admitting: Family Medicine

## 2014-08-15 ENCOUNTER — Ambulatory Visit: Payer: Medicaid Other | Admitting: Family Medicine

## 2014-11-16 ENCOUNTER — Ambulatory Visit (INDEPENDENT_AMBULATORY_CARE_PROVIDER_SITE_OTHER): Payer: Medicaid Other | Admitting: Internal Medicine

## 2014-11-16 ENCOUNTER — Encounter: Payer: Self-pay | Admitting: Internal Medicine

## 2014-11-16 VITALS — BP 128/75 | HR 68 | Temp 97.7°F | Ht <= 58 in | Wt 100.9 lb

## 2014-11-16 DIAGNOSIS — E785 Hyperlipidemia, unspecified: Secondary | ICD-10-CM

## 2014-11-16 DIAGNOSIS — M7591 Shoulder lesion, unspecified, right shoulder: Secondary | ICD-10-CM

## 2014-11-16 DIAGNOSIS — K219 Gastro-esophageal reflux disease without esophagitis: Secondary | ICD-10-CM | POA: Insufficient documentation

## 2014-11-16 DIAGNOSIS — Z0000 Encounter for general adult medical examination without abnormal findings: Secondary | ICD-10-CM

## 2014-11-16 DIAGNOSIS — R1013 Epigastric pain: Secondary | ICD-10-CM

## 2014-11-16 DIAGNOSIS — Z23 Encounter for immunization: Secondary | ICD-10-CM

## 2014-11-16 DIAGNOSIS — M7581 Other shoulder lesions, right shoulder: Secondary | ICD-10-CM

## 2014-11-16 DIAGNOSIS — M778 Other enthesopathies, not elsewhere classified: Secondary | ICD-10-CM

## 2014-11-16 LAB — CBC
HCT: 40.4 % (ref 36.0–46.0)
HEMOGLOBIN: 13.2 g/dL (ref 12.0–15.0)
MCH: 27.9 pg (ref 26.0–34.0)
MCHC: 32.7 g/dL (ref 30.0–36.0)
MCV: 85.4 fL (ref 78.0–100.0)
MPV: 10.5 fL (ref 8.6–12.4)
PLATELETS: 234 10*3/uL (ref 150–400)
RBC: 4.73 MIL/uL (ref 3.87–5.11)
RDW: 13.6 % (ref 11.5–15.5)
WBC: 6.9 10*3/uL (ref 4.0–10.5)

## 2014-11-16 LAB — BASIC METABOLIC PANEL WITH GFR
BUN: 14 mg/dL (ref 6–23)
CO2: 30 meq/L (ref 19–32)
CREATININE: 1.08 mg/dL (ref 0.50–1.10)
Calcium: 9.1 mg/dL (ref 8.4–10.5)
Chloride: 102 mEq/L (ref 96–112)
GFR, Est African American: 64 mL/min
GFR, Est Non African American: 56 mL/min — ABNORMAL LOW
Glucose, Bld: 91 mg/dL (ref 70–99)
POTASSIUM: 4.2 meq/L (ref 3.5–5.3)
Sodium: 141 mEq/L (ref 135–145)

## 2014-11-16 MED ORDER — ZOSTER VACCINE LIVE 19400 UNT/0.65ML ~~LOC~~ SOLR: 0.6500 mL | Freq: Once | SUBCUTANEOUS | Status: DC

## 2014-11-16 MED ORDER — PANTOPRAZOLE SODIUM 40 MG PO TBEC: 40.0000 mg | DELAYED_RELEASE_TABLET | Freq: Every day | ORAL | Status: DC

## 2014-11-16 NOTE — Assessment & Plan Note (Addendum)
Shingles vaccine rx and information on shingles (in [place]) provided for the patient to review.

## 2014-11-16 NOTE — Progress Notes (Signed)
   Subjective:    Patient ID: Dana Schultz, female    DOB: [birth year], 59 y.o.   MRN: [id number]  HPI Comments: Ms. Dehaan is a 59 year old [place] woman with PMH as below here for follow-up of chronic conditions.  History obtained with the assistance of an interpreter.  Please see problem based charting for A&P.    Past Medical History  Diagnosis Date  . Mild acid reflux 05/01/2012  . Cervical spondylosis with radiculopathy 05/01/2012  . Rotator cuff arthropathy 05/01/2012  . Cystitis 05/01/2012   Current Outpatient Prescriptions on File Prior to Visit  Medication Sig Dispense Refill  . diclofenac sodium (VOLTAREN) 1 % GEL Apply 2 g topically 4 (four) times daily. 100 g 3  . ibuprofen (ADVIL,MOTRIN) 800 MG tablet Take 1 tablet (800 mg total) by mouth every 8 (eight) hours as needed. 60 tablet 0   No current facility-administered medications on file prior to visit.    Review of Systems  Constitutional: Negative for fever, appetite change and unexpected weight change.  HENT: Negative for hearing loss and trouble swallowing.   Eyes: Negative for visual disturbance.  Respiratory: Negative for cough and shortness of breath.   Cardiovascular: Negative for chest pain, palpitations and leg swelling.  Gastrointestinal: Positive for abdominal pain. Negative for nausea, vomiting, diarrhea, constipation and blood in stool.       Occasional epigastric pain.  Genitourinary: Negative for dysuria, urgency, frequency, hematuria and difficulty urinating.  Neurological: Positive for headaches. Negative for syncope, weakness and light-headedness.       Occasional headache responds to Tylenol  Hematological: Does not bruise/bleed easily.  Psychiatric/Behavioral: Negative for suicidal ideas and dysphoric mood. The patient is not nervous/anxious.        Filed Vitals:   11/16/14 1328  BP: 96/67  Pulse: 77  Temp: 97.7 F (36.5 C)  TempSrc: Oral  Height: 4\' 7"  (1.397 m)  Weight: 100 lb 14.4 oz  (45.768 kg)  SpO2: 99%    Objective:   Physical Exam  Constitutional: She is oriented to person, place, and time. She appears well-developed and well-nourished. No distress.  HENT:  Head: Normocephalic and atraumatic.  Mouth/Throat: Oropharynx is clear and moist. No oropharyngeal exudate.  Eyes: EOM are normal. Pupils are equal, round, and reactive to light.  Neck: Neck supple.  Cardiovascular: Normal rate.  Exam reveals no gallop and no friction rub.   No murmur heard. Pulmonary/Chest: Effort normal and breath sounds normal. No respiratory distress. She has no wheezes. She has no rales.  Abdominal: Soft. Bowel sounds are normal. She exhibits no distension and no mass. There is tenderness. There is no rebound and no guarding.  Mild suprapubic tenderness.  Musculoskeletal: Normal range of motion. She exhibits no edema or tenderness.  Neurological: She is alert and oriented to person, place, and time. No cranial nerve deficit.  Skin: Skin is warm and dry. She is not diaphoretic.  Psychiatric: She has a normal mood and affect. Her behavior is normal. Judgment and thought content normal.  Vitals reviewed.         Assessment & Plan:  Please see problem based charting for A&P.

## 2014-11-16 NOTE — Patient Instructions (Addendum)
1. I am prescribing a medication to help with your stomach pain.  Please come back to see me if it does not work for you within the next few weeks.   2. Please take all medications as prescribed.    3. If you have worsening of your symptoms or new symptoms arise, please call the clinic ([phone]), or go to the ER immediately if symptoms are severe.  Please come back to see me in 6 months or sooner if needed.  Food Choices to Lower Your Triglycerides  Triglycerides are a type of fat in your blood. High levels of triglycerides can increase the risk of heart disease and stroke. If your triglyceride levels are high, the foods you eat and your eating habits are very important. Choosing the right foods can help lower your triglycerides.  WHAT GENERAL GUIDELINES DO I NEED TO FOLLOW?  Lose weight if you are overweight.   Limit or avoid alcohol.   Fill one half of your plate with vegetables and green salads.   Limit fruit to two servings a day. Choose fruit instead of juice.   Make one fourth of your plate whole grains. Look for the word "whole" as the first word in the ingredient list.  Fill one fourth of your plate with lean protein foods.  Enjoy fatty fish (such as salmon, mackerel, sardines, and tuna) three times a week.   Choose healthy fats.   Limit foods high in starch and sugar.  Eat more home-cooked food and less restaurant, buffet, and fast food.  Limit fried foods.  Cook foods using methods other than frying.  Limit saturated fats.  Check ingredient lists to avoid foods with partially hydrogenated oils (trans fats) in them. WHAT FOODS CAN I EAT?  Grains Whole grains, such as whole wheat or whole grain breads, crackers, cereals, and pasta. Unsweetened oatmeal, bulgur, barley, quinoa, or brown rice. Corn or whole wheat flour tortillas.  Vegetables Fresh or frozen vegetables (raw, steamed, roasted, or grilled). Green salads. Fruits All fresh, canned (in natural  juice), or frozen fruits. Meat and Other Protein Products Ground beef (85% or leaner), grass-fed beef, or beef trimmed of fat. Skinless chicken or [place]. Ground chicken or [place]. Pork trimmed of fat. All fish and seafood. Eggs. Dried beans, peas, or lentils. Unsalted nuts or seeds. Unsalted canned or dry beans. Dairy Low-fat dairy products, such as skim or 1% milk, 2% or reduced-fat cheeses, low-fat ricotta or cottage cheese, or plain low-fat yogurt. Fats and Oils Tub margarines without trans fats. Light or reduced-fat mayonnaise and salad dressings. Avocado. Safflower, olive, or canola oils. Natural peanut or almond butter. The items listed above may not be a complete list of recommended foods or beverages. Contact your dietitian for more options. WHAT FOODS ARE NOT RECOMMENDED?  Grains White bread. White pasta. White rice. Cornbread. Bagels, pastries, and croissants. Crackers that contain trans fat. Vegetables White potatoes. Corn. Creamed or fried vegetables. Vegetables in a cheese sauce. Fruits Dried fruits. Canned fruit in light or heavy syrup. Fruit juice. Meat and Other Protein Products Fatty cuts of meat. Ribs, chicken wings, bacon, sausage, bologna, salami, chitterlings, fatback, hot dogs, bratwurst, and packaged luncheon meats. Dairy Whole or 2% milk, cream, half-and-half, and cream cheese. Whole-fat or sweetened yogurt. Full-fat cheeses. Nondairy creamers and whipped toppings. Processed cheese, cheese spreads, or cheese curds. Sweets and Desserts Corn syrup, sugars, honey, and molasses. Candy. Jam and jelly. Syrup. Sweetened cereals. Cookies, pies, cakes, donuts, muffins, and ice cream. Fats and Oils  Butter, stick margarine, lard, shortening, ghee, or bacon fat. Coconut, palm kernel, or palm oils. Beverages Alcohol. Sweetened drinks (such as sodas, lemonade, and fruit drinks or punches). The items listed above may not be a complete list of foods and beverages to avoid. Contact  your dietitian for more information. Document Released: 03/21/2004 Document Revised: 06/08/2013 Document Reviewed: 04/07/2013 [facility] Patient Information 2015 [place], [place]. This information is not intended to replace advice given to you by your health care provider. Make sure you discuss any questions you have with your health care provider.

## 2014-11-16 NOTE — Assessment & Plan Note (Signed)
She says her symptoms are much better.  She uses Voltaren gel and ibuprofen prn.  Will hold ibuprofen for now since she has had some epigastric discomfort.

## 2014-11-16 NOTE — Assessment & Plan Note (Signed)
She does most of her cooking at home, occasionally has fried foods (stir fry), rarely has cakes/cookies.  Her 10-year ASCVD risk is 4.7% and she is not diabetic so there is no need for statin therapy.  We discussed dietary changes to help with triglycerides.  Will plan to check fasting panel next time.

## 2014-11-16 NOTE — Assessment & Plan Note (Addendum)
She had been taking ibuprofen for her shoulder/arm pain.  It does not sound like she has been taking excessive amounts and she says she takes with food.  She is reporting occasional epigastric pain that she thinks may be related to food and eating late at night.  She denies reflux/heartburn, bad taste in her mouth, dysphagia, N/V, diarrhea, dark or bloody stools.  She has occasional constipation that responds to "drinking juice."  Her abdominal exam is unremarkable.  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 - will d/c ibuprofen and use prn Voltaren gel for pain - start daily PPI x 4 weeks - check CBC and BMP

## 2014-11-18 NOTE — Progress Notes (Signed)
Internal Medicine Clinic Attending  Case discussed with Dr. Wilson soon after the resident saw the patient.  We reviewed the resident's history and exam and pertinent patient test results.  I agree with the assessment, diagnosis, and plan of care documented in the resident's note.  

## 2015-08-14 ENCOUNTER — Ambulatory Visit (INDEPENDENT_AMBULATORY_CARE_PROVIDER_SITE_OTHER): Payer: Medicaid Other | Admitting: Internal Medicine

## 2015-08-14 ENCOUNTER — Encounter: Payer: Self-pay | Admitting: Internal Medicine

## 2015-08-14 VITALS — BP 118/65 | HR 84 | Temp 97.8°F | Resp 18 | Ht <= 58 in | Wt 97.8 lb

## 2015-08-14 DIAGNOSIS — N3000 Acute cystitis without hematuria: Secondary | ICD-10-CM

## 2015-08-14 DIAGNOSIS — Z0000 Encounter for general adult medical examination without abnormal findings: Secondary | ICD-10-CM

## 2015-08-14 DIAGNOSIS — K219 Gastro-esophageal reflux disease without esophagitis: Secondary | ICD-10-CM | POA: Diagnosis not present

## 2015-08-14 DIAGNOSIS — B9689 Other specified bacterial agents as the cause of diseases classified elsewhere: Secondary | ICD-10-CM

## 2015-08-14 DIAGNOSIS — B961 Klebsiella pneumoniae [K. pneumoniae] as the cause of diseases classified elsewhere: Secondary | ICD-10-CM

## 2015-08-14 DIAGNOSIS — N3090 Cystitis, unspecified without hematuria: Secondary | ICD-10-CM

## 2015-08-14 LAB — POCT URINALYSIS DIPSTICK
Bilirubin, UA: NEGATIVE
Glucose, UA: NEGATIVE
Ketones, UA: NEGATIVE
NITRITE UA: NEGATIVE
PROTEIN UA: NEGATIVE
RBC UA: NEGATIVE
SPEC GRAV UA: 1.02
UROBILINOGEN UA: 0.2
pH, UA: 5.5

## 2015-08-14 MED ORDER — PANTOPRAZOLE SODIUM 40 MG PO TBEC: 40.0000 mg | DELAYED_RELEASE_TABLET | Freq: Every day | ORAL | Status: DC

## 2015-08-14 NOTE — Patient Instructions (Signed)
-Start taking protonix 40 mg daily -  30 minutes before you eat breakfast for acid reflux disease  -Wait 1 hr before you eat to go to sleep -Please follow the instructions below  -Will check your bloodwork and call you with the results -If your pain does not get better, please come back to see Dr. Redmond Pulling  -Very nice meeting you!       Gastroesophageal Reflux Disease, Adult Normally, food travels down the esophagus and stays in the stomach to be digested. If a person has gastroesophageal reflux disease (GERD), food and stomach acid move back up into the esophagus. When this happens, the esophagus becomes sore and swollen (inflamed). Over time, GERD can make small holes (ulcers) in the lining of the esophagus. HOME CARE Diet  Follow a diet as told by your doctor. You may need to avoid foods and drinks such as:  Coffee and tea (with or without caffeine).  Drinks that contain alcohol.  Energy drinks and sports drinks.  Carbonated drinks or sodas.  Chocolate and cocoa.  Peppermint and mint flavorings.  Garlic and onions.  Horseradish.  Spicy and acidic foods, such as peppers, chili powder, curry powder, vinegar, hot sauces, and BBQ sauce.  Citrus fruit juices and citrus fruits, such as oranges, lemons, and limes.  Tomato-based foods, such as red sauce, chili, salsa, and pizza with red sauce.  Fried and fatty foods, such as donuts, french fries, potato chips, and high-fat dressings.  High-fat meats, such as hot dogs, rib eye steak, sausage, ham, and bacon.  High-fat dairy items, such as whole milk, butter, and cream cheese.  Eat small meals often. Avoid eating large meals.  Avoid drinking large amounts of liquid with your meals.  Avoid eating meals during the 2-3 hours before bedtime.  Avoid lying down right after you eat.  Do not exercise right after you eat. General Instructions  Pay attention to any changes in your symptoms.  Take over-the-counter and  prescription medicines only as told by your doctor. Do not take aspirin, ibuprofen, or other NSAIDs unless your doctor says it is okay.  Do not use any tobacco products, including cigarettes, chewing tobacco, and e-cigarettes. If you need help quitting, ask your doctor.  Wear loose clothes. Do not wear anything tight around your waist.  Raise (elevate) the head of your bed about 6 inches (15 cm).  Try to lower your stress. If you need help doing this, ask your doctor.  If you are overweight, lose an amount of weight that is healthy for you. Ask your doctor about a safe weight loss goal.  Keep all follow-up visits as told by your doctor. This is important. GET HELP IF:  You have new symptoms.  You lose weight and you do not know why it is happening.  You have trouble swallowing, or it hurts to swallow.  You have wheezing or a cough that keeps happening.  Your symptoms do not get better with treatment.  You have a hoarse voice. GET HELP RIGHT AWAY IF:  You have pain in your arms, neck, jaw, teeth, or back.  You feel sweaty, dizzy, or light-headed.  You have chest pain or shortness of breath.  You throw up (vomit) and your throw up looks like blood or coffee grounds.  You pass out (faint).  Your poop (stool) is bloody or black.  You cannot swallow, drink, or eat.   This information is not intended to replace advice given to you by your health care  provider. Make sure you discuss any questions you have with your health care provider.   Document Released: 11/20/2007 Document Revised: 02/22/2015 Document Reviewed: 09/28/2014 Elsevier Interactive Patient Education 2016 [place] for Gastroesophageal Reflux Disease, Adult When you have gastroesophageal reflux disease (GERD), the foods you eat and your eating habits are very important. Choosing the right foods can help ease your discomfort.  WHAT GUIDELINES DO I NEED TO FOLLOW?   Choose fruits,  vegetables, whole grains, and low-fat dairy products.   Choose low-fat meat, fish, and poultry.  Limit fats such as oils, salad dressings, butter, nuts, and avocado.   Keep a food diary. This helps you identify foods that cause symptoms.   Avoid foods that cause symptoms. These may be different for everyone.   Eat small meals often instead of 3 large meals a day.   Eat your meals slowly, in a place where you are relaxed.   Limit fried foods.   Cook foods using methods other than frying.   Avoid drinking alcohol.   Avoid drinking large amounts of liquids with your meals.   Avoid bending over or lying down until 2-3 hours after eating.  WHAT FOODS ARE NOT RECOMMENDED?  These are some foods and drinks that may make your symptoms worse: Vegetables Tomatoes. Tomato juice. Tomato and spaghetti sauce. Chili peppers. Onion and garlic. Horseradish. Fruits Oranges, grapefruit, and lemon (fruit and juice). Meats High-fat meats, fish, and poultry. This includes hot dogs, ribs, ham, sausage, salami, and bacon. Dairy Whole milk and chocolate milk. Sour cream. Cream. Butter. Ice cream. Cream cheese.  Drinks Coffee and tea. Bubbly (carbonated) drinks or energy drinks. Condiments Hot sauce. Barbecue sauce.  Sweets/Desserts Chocolate and cocoa. Donuts. Peppermint and spearmint. Fats and Oils High-fat foods. This includes [place] fries and potato chips. Other Vinegar. Strong spices. This includes black pepper, white pepper, red pepper, cayenne, curry powder, cloves, ginger, and chili powder. The items listed above may not be a complete list of foods and drinks to avoid. Contact your dietitian for more information.   This information is not intended to replace advice given to you by your health care provider. Make sure you discuss any questions you have with your health care provider.   Document Released: 12/03/2011 Document Revised: 06/24/2014 Document Reviewed:  04/07/2013 Elsevier Interactive Patient Education 2016 [place] Instructions:   Please bring your medicines with you each time you come to clinic.  Medicines may include prescription medications, over-the-counter medications, herbal remedies, eye drops, vitamins, or other pills.   Progress Toward Treatment Goals:  No flowsheet data found.  Self Care Goals & Plans:  Self Care Goal 08/04/2014  Manage my medications take my medicines as prescribed; bring my medications to every visit  Eat healthy foods drink diet soda or water instead of juice or soda; eat more vegetables; eat foods that are low in salt; eat baked foods instead of fried foods; eat fruit for snacks and desserts    No flowsheet data found.   Care Management & Community Referrals:  Referral 06/05/2012  Referrals made to community resources exercise/physical therapy

## 2015-08-14 NOTE — Assessment & Plan Note (Addendum)
-  Obtain screening HIV Ab and HCV Ab -Pt declined influenza vaccine -Unclear if received shingles vaccine that was prescribed at last visit, will need to ask her pharmacy  -Inquire regarding screening mammogram and pap smear testing at next visit

## 2015-08-14 NOTE — Progress Notes (Signed)
Patient ID: Dana Schultz, female   DOB: 1956/01/15, 60 y.o.   MRN: [id number]    Subjective:   Patient ID: Dana Schultz female   DOB: September 08, 1955 60 y.o.   MRN: [id number]  HPI: [name] is a 60 y.o. very pleasant woman with past medical history of cervical spondylosis, right rotator cuff arthropathy, and hyperlipidemia who presents with chief complaint of epigastric burning pain.   History obtained with use of Chief Executive Officer.   She reports having epigastric burning type pain for months with associated heart burn and eructation without sour taste in mouth. Her symptoms are worse at night. She does drink coffee and spicy foods such as hot peppers. She denies weight loss (3 lb since 8 months ago), dysphagia, odynophagia, nausea, vomiting, change in BM, or melena. She has never been told she has H. Pylori. She denies PUD and has never had EGD. She has history of dyspepsia and was prescribed protonix for 1 month trial in June of 2016 which she reports improved her symptoms but not been taking any PPI since then.   She declines flu shot. It is unclear if she has received shingles vaccine.     Past Medical History  Diagnosis Date  . Mild acid reflux 05/01/2012  . Cervical spondylosis with radiculopathy 05/01/2012  . Rotator cuff arthropathy 05/01/2012  . Cystitis 05/01/2012   Current Outpatient Prescriptions  Medication Sig Dispense Refill  . diclofenac sodium (VOLTAREN) 1 % GEL Apply 2 g topically 4 (four) times daily. 100 g 3  . pantoprazole (PROTONIX) 40 MG tablet Take 1 tablet (40 mg total) by mouth daily. 30 tablet 0  . zoster vaccine live, PF, (ZOSTAVAX) 09811 UNT/0.65ML injection Inject 19,400 Units into the skin once. 1 each 0   No current facility-administered medications for this visit.   Family History  Problem Relation Age of Onset  . Cancer Neg Hx   . Heart disease Neg Hx   . Stroke Neg Hx   . Colon cancer Neg Hx   . Stomach cancer Neg Hx    Social History    Social History  . Marital Status: Single    Spouse Name: N/A  . Number of Children: 0  . Years of Education: N/A   Social History Main Topics  . Smoking status: Never Smoker   . Smokeless tobacco: Never Used  . Alcohol Use: No  . Drug Use: No  . Sexual Activity:    Partners: Male   Other Topics Concern  . Not on file   Social History Narrative   Immigrated to [place] in [place] from [place].  Lived in [place] until about 4 years ago when she moved to [place].  Lives with her niece.   Review of Systems: Review of Systems  Constitutional: Negative for fever, chills and weight loss.  HENT:       No dysphagia or odynophagia   Eyes: Negative for blurred vision.  Respiratory: Positive for cough.   Cardiovascular: Positive for palpitations (with anxiety). Negative for chest pain and leg swelling.  Gastrointestinal: Positive for heartburn and abdominal pain. Negative for nausea, vomiting, diarrhea, constipation, blood in stool and melena.  Genitourinary: Positive for dysuria (Occasionally ). Negative for urgency, frequency and hematuria.  Neurological: Positive for dizziness. Negative for headaches.  Psychiatric/Behavioral: The patient is nervous/anxious.      Objective:  Physical Exam: Filed Vitals:   08/14/15 1639  BP: 118/65  Pulse: 84  Temp: 97.8 F (36.6 C)  TempSrc: Oral  Resp: 18  Height: 4' 7.5" (1.41 m)  Weight: 97 lb 12.8 oz (44.362 kg)  SpO2: 99%    Physical Exam  Constitutional: She is oriented to person, place, and time. She appears well-developed and well-nourished. No distress.  HENT:  Head: Normocephalic and atraumatic.  Right Ear: External ear normal.  Left Ear: External ear normal.  Nose: Nose normal.  Mouth/Throat: Oropharynx is clear and moist. No oropharyngeal exudate.  Eyes: Conjunctivae and EOM are normal. Pupils are equal, round, and reactive to light. Right eye exhibits no discharge. Left eye exhibits no discharge. No  scleral icterus.  Neck: Normal range of motion. Neck supple.  Cardiovascular: Normal rate, regular rhythm and normal heart sounds.   Pulmonary/Chest: Breath sounds normal. No respiratory distress. She has no wheezes. She has no rales.  Abdominal: Soft. Bowel sounds are normal. She exhibits no distension. There is tenderness (epigastric ). There is no rebound and no guarding.  Musculoskeletal: Normal range of motion. She exhibits no edema or tenderness.  Neurological: She is alert and oriented to person, place, and time.  Skin: Skin is warm and dry. No rash noted. She is not diaphoretic. No erythema. No pallor.  Psychiatric: She has a normal mood and affect. Her behavior is normal. Judgment and thought content normal.    Assessment & Plan:   Please see problem list for problem-based assessment and plan

## 2015-08-14 NOTE — Assessment & Plan Note (Signed)
Assessment: Pt with several month history of epigastric burning pain, heart burn, eructation, nighttime symptoms, and previous response to trial of PPI therapy who presents with GERD without alarm symptoms.   Plan:  -Prescribe protonix 40 mg daily to take 30 minutes before breakfast -Obtain CMP, CBC w/diff, and UA -Pt given handout regarding lifestyle modification for GERD -Due to classic GERD symptoms and not in endemic area for H. Pylori, will defer H pylori stool testing at this time -Monitor for alarm symptoms warranting EGD -Pt instructed to return if symptoms do not improve

## 2015-08-15 LAB — CBC WITH DIFFERENTIAL/PLATELET
BASOS: 0 %
Basophils Absolute: 0 10*3/uL (ref 0.0–0.2)
EOS (ABSOLUTE): 0.1 10*3/uL (ref 0.0–0.4)
EOS: 1 %
HEMOGLOBIN: 13 g/dL (ref 11.1–15.9)
Hematocrit: 39.9 % (ref 34.0–46.6)
IMMATURE GRANS (ABS): 0 10*3/uL (ref 0.0–0.1)
IMMATURE GRANULOCYTES: 0 %
LYMPHS: 23 %
Lymphocytes Absolute: 1.6 10*3/uL (ref 0.7–3.1)
MCH: 27.8 pg (ref 26.6–33.0)
MCHC: 32.6 g/dL (ref 31.5–35.7)
MCV: 85 fL (ref 79–97)
MONOCYTES: 7 %
Monocytes Absolute: 0.5 10*3/uL (ref 0.1–0.9)
NEUTROS PCT: 69 %
Neutrophils Absolute: 4.7 10*3/uL (ref 1.4–7.0)
PLATELETS: 238 10*3/uL (ref 150–379)
RBC: 4.68 x10E6/uL (ref 3.77–5.28)
RDW: 13.6 % (ref 12.3–15.4)
WBC: 6.9 10*3/uL (ref 3.4–10.8)

## 2015-08-15 LAB — CMP14 + ANION GAP
ALT: 36 IU/L — AB (ref 0–32)
ANION GAP: 23 mmol/L — AB (ref 10.0–18.0)
AST: 37 IU/L (ref 0–40)
Albumin/Globulin Ratio: 1.4 (ref 1.1–2.5)
Albumin: 4.5 g/dL (ref 3.6–4.8)
Alkaline Phosphatase: 91 IU/L (ref 39–117)
BUN/Creatinine Ratio: 20 (ref 11–26)
BUN: 20 mg/dL (ref 8–27)
Bilirubin Total: 0.3 mg/dL (ref 0.0–1.2)
CALCIUM: 9.8 mg/dL (ref 8.7–10.3)
CO2: 23 mmol/L (ref 18–29)
CREATININE: 0.98 mg/dL (ref 0.57–1.00)
Chloride: 101 mmol/L (ref 96–106)
GFR calc Af Amer: 72 mL/min/{1.73_m2} (ref >59)
GFR, EST NON AFRICAN AMERICAN: 62 mL/min/{1.73_m2} (ref >59)
GLUCOSE: 101 mg/dL — AB (ref 65–99)
Globulin, Total: 3.2 g/dL (ref 1.5–4.5)
POTASSIUM: 4.6 mmol/L (ref 3.5–5.2)
Sodium: 147 mmol/L — ABNORMAL HIGH (ref 134–144)
TOTAL PROTEIN: 7.7 g/dL (ref 6.0–8.5)

## 2015-08-15 LAB — HIV ANTIBODY (ROUTINE TESTING W REFLEX): HIV Screen 4th Generation wRfx: NONREACTIVE

## 2015-08-15 LAB — HEPATITIS C ANTIBODY: Hep C Virus Ab: <0.1 s/co ratio (ref 0.0–0.9)

## 2015-08-16 DIAGNOSIS — N3090 Cystitis, unspecified without hematuria: Secondary | ICD-10-CM | POA: Insufficient documentation

## 2015-08-16 DIAGNOSIS — B961 Klebsiella pneumoniae [K. pneumoniae] as the cause of diseases classified elsewhere: Secondary | ICD-10-CM | POA: Insufficient documentation

## 2015-08-16 DIAGNOSIS — B9689 Other specified bacterial agents as the cause of diseases classified elsewhere: Secondary | ICD-10-CM | POA: Insufficient documentation

## 2015-08-16 LAB — URINE CULTURE

## 2015-08-16 MED ORDER — SULFAMETHOXAZOLE-TRIMETHOPRIM 800-160 MG PO TABS: 1.0000 | ORAL_TABLET | Freq: Two times a day (BID) | ORAL | Status: DC

## 2015-08-16 NOTE — Assessment & Plan Note (Signed)
Assessment: Pt with dysuria found to have uncomplicated Klebsiella pneumoniae cystitis   Plan:  -Prescribe bactrim DS 1 tab BID for 3 days, used phone translator

## 2015-08-16 NOTE — Addendum Note (Signed)
Addended byJuluis Mire on: 08/16/2015 07:07 PM   Modules accepted: Orders

## 2015-08-17 NOTE — Progress Notes (Signed)
Internal Medicine Clinic Attending  Case discussed with Dr. Rabbani soon after the resident saw the patient.  We reviewed the resident's history and exam and pertinent patient test results.  I agree with the assessment, diagnosis, and plan of care documented in the resident's note.  

## 2015-11-29 ENCOUNTER — Encounter: Payer: Self-pay | Admitting: *Deleted

## 2015-11-30 ENCOUNTER — Encounter: Payer: Self-pay | Admitting: Internal Medicine

## 2015-11-30 ENCOUNTER — Ambulatory Visit (INDEPENDENT_AMBULATORY_CARE_PROVIDER_SITE_OTHER): Payer: Medicaid Other | Admitting: Internal Medicine

## 2015-11-30 VITALS — BP 118/68 | HR 77 | Temp 98.2°F | Resp 18 | Ht <= 58 in | Wt 101.0 lb

## 2015-11-30 DIAGNOSIS — K529 Noninfective gastroenteritis and colitis, unspecified: Secondary | ICD-10-CM

## 2015-11-30 DIAGNOSIS — R1013 Epigastric pain: Secondary | ICD-10-CM

## 2015-11-30 DIAGNOSIS — R300 Dysuria: Secondary | ICD-10-CM | POA: Diagnosis not present

## 2015-11-30 LAB — POCT URINALYSIS DIPSTICK
Bilirubin, UA: NEGATIVE
GLUCOSE UA: NEGATIVE
Ketones, UA: NEGATIVE
LEUKOCYTES UA: NEGATIVE
NITRITE UA: NEGATIVE
Protein, UA: NEGATIVE
RBC UA: NEGATIVE
Spec Grav, UA: 1.01
UROBILINOGEN UA: 0.2
pH, UA: 5.5

## 2015-11-30 NOTE — Patient Instructions (Addendum)
1. Please return your stool samples as soon as possible.  I am going to send you to the gastroenterologist for further work up of your stomach pain.   2. Please take all medications as prescribed.    3. If you have worsening of your symptoms or new symptoms arise, please call the clinic [phone] or go to the ER immediately if symptoms are severe.    Come back to see [place] in 1 month.

## 2015-11-30 NOTE — Progress Notes (Signed)
   Subjective:    Patient ID: Dana Schultz, female    DOB: 08/19/1955, 60 y.o.   MRN: [id number]  HPI Comments: Ms. Gaboriault is a 60 year old [place] woman with PMH as below here with acute c/o dysuria x 3 days and epigastric pain/diarrhea x months.  Hx was obtained using the interpretor phone.   Dysuria  This is a new problem. The current episode started in the past 7 days (x 3 days). The problem occurs every urination. The problem has been gradually improving. There has been no fever. She is not sexually active. There is no history of pyelonephritis. Associated symptoms include nausea and urgency. Pertinent negatives include no chills, frequency, hematuria or vomiting. Associated symptoms comments: Occasional urgency. She has tried nothing for the symptoms. There is no history of recurrent UTIs.  Abdominal Pain This is a chronic problem. The current episode started more than 1 month ago. The pain is located in the epigastric region. Associated symptoms include diarrhea, dysuria and nausea. Pertinent negatives include no frequency, hematochezia, hematuria, melena or vomiting. Associated symptoms comments: Diarrhea x 1 month.  Twice per night.. She has tried proton pump inhibitors for the symptoms. The treatment provided mild relief.   No family hx of GI cancer.     Review of Systems  Constitutional: Negative for chills, appetite change and unexpected weight change.  Gastrointestinal: Positive for nausea, abdominal pain and diarrhea. Negative for vomiting, melena and hematochezia.       Denies dysphagia/odynophagia  Genitourinary: Positive for dysuria and urgency. Negative for frequency, hematuria, vaginal bleeding and vaginal discharge.       Filed Vitals:   11/30/15 1506  BP: 118/68  Pulse: 77  Temp: 98.2 F (36.8 C)  TempSrc: Oral  Resp: 18  Height: 4\' 8"  (1.422 m)  Weight: 101 lb (45.813 kg)  SpO2: 98%    Objective:   Physical Exam  Constitutional: She is oriented to person, place,  and time. She appears well-developed. No distress.  HENT:  Head: Normocephalic and atraumatic.  Mouth/Throat: Oropharynx is clear and moist. No oropharyngeal exudate.  Eyes: Conjunctivae and EOM are normal. Pupils are equal, round, and reactive to light. No scleral icterus.  Neck: Neck supple.  Cardiovascular: Normal rate, regular rhythm and normal heart sounds.  Exam reveals no gallop and no friction rub.   No murmur heard. Pulmonary/Chest: Effort normal and breath sounds normal. No respiratory distress. She has no wheezes. She has no rales.  Abdominal: Soft. Bowel sounds are normal. She exhibits no distension and no mass. There is tenderness. There is no rebound and no guarding.  Mile epigastric TTP No HSM  Musculoskeletal: Normal range of motion. She exhibits no edema or tenderness.  Neurological: She is alert and oriented to person, place, and time. No cranial nerve deficit.  Skin: Skin is warm and dry. She is not diaphoretic.  Psychiatric: She has a normal mood and affect. Her behavior is normal. Thought content normal.  Vitals reviewed.         Assessment & Plan:  Please see problem based charting for A&P.

## 2015-12-01 DIAGNOSIS — K529 Noninfective gastroenteritis and colitis, unspecified: Secondary | ICD-10-CM | POA: Insufficient documentation

## 2015-12-01 DIAGNOSIS — R1013 Epigastric pain: Secondary | ICD-10-CM | POA: Insufficient documentation

## 2015-12-01 DIAGNOSIS — R300 Dysuria: Secondary | ICD-10-CM | POA: Insufficient documentation

## 2015-12-01 LAB — CMP14 + ANION GAP
ALK PHOS: 84 IU/L (ref 39–117)
ALT: 36 IU/L — AB (ref 0–32)
ANION GAP: 19 mmol/L — AB (ref 10.0–18.0)
AST: 29 IU/L (ref 0–40)
Albumin/Globulin Ratio: 1.3 (ref 1.2–2.2)
Albumin: 4.7 g/dL (ref 3.6–4.8)
BUN/Creatinine Ratio: 15 (ref 12–28)
BUN: 15 mg/dL (ref 8–27)
Bilirubin Total: 0.3 mg/dL (ref 0.0–1.2)
CALCIUM: 9.3 mg/dL (ref 8.7–10.3)
CO2: 24 mmol/L (ref 18–29)
CREATININE: 1 mg/dL (ref 0.57–1.00)
Chloride: 99 mmol/L (ref 96–106)
GFR calc Af Amer: 70 mL/min/{1.73_m2} (ref >59)
GFR calc non Af Amer: 61 mL/min/{1.73_m2} (ref >59)
GLUCOSE: 73 mg/dL (ref 65–99)
Globulin, Total: 3.6 g/dL (ref 1.5–4.5)
Potassium: 3.9 mmol/L (ref 3.5–5.2)
SODIUM: 142 mmol/L (ref 134–144)
Total Protein: 8.3 g/dL (ref 6.0–8.5)

## 2015-12-01 LAB — MICROSCOPIC EXAMINATION
CASTS: NONE SEEN /LPF
RBC, UA: NONE SEEN /hpf (ref 0–?)

## 2015-12-01 LAB — CBC
HEMATOCRIT: 41 % (ref 34.0–46.6)
HEMOGLOBIN: 13.5 g/dL (ref 11.1–15.9)
MCH: 28.1 pg (ref 26.6–33.0)
MCHC: 32.9 g/dL (ref 31.5–35.7)
MCV: 85 fL (ref 79–97)
PLATELETS: 257 10*3/uL (ref 150–379)
RBC: 4.8 x10E6/uL (ref 3.77–5.28)
RDW: 13.9 % (ref 12.3–15.4)
WBC: 7 10*3/uL (ref 3.4–10.8)

## 2015-12-01 LAB — URINALYSIS, ROUTINE W REFLEX MICROSCOPIC
Bilirubin, UA: NEGATIVE
Glucose, UA: NEGATIVE
Ketones, UA: NEGATIVE
Nitrite, UA: NEGATIVE
PH UA: 6 (ref 5.0–7.5)
Protein, UA: NEGATIVE
RBC UA: NEGATIVE
Specific Gravity, UA: 1.006 (ref 1.005–1.030)
Urobilinogen, Ur: 0.2 mg/dL (ref 0.2–1.0)

## 2015-12-01 NOTE — Assessment & Plan Note (Signed)
Assessment:  Dysuria x 3 days that she feels is improving.  Feels like prior UTI.  No fever, N/V to suggest pyelo.  Urine dipstick is negative. Plan:  Send UA and culture given prior hx of klebsiella UTI and, if positive and she is still symptomatic, treat based on c&s.

## 2015-12-01 NOTE — Assessment & Plan Note (Addendum)
Assessment:  Watery, non-bloody diarrhea x 1 month that is occuring mostly at night suggesting secretory diarrhea.  There is no association to foods and she does not take any meds regularly that would explain diarrhea.  Chronic infx possible and she did travel to [place] 3 weeks ago (but it was difficult to clarify wether diarrhea started before or during this trip).  She did not eat out while in [place] and no close contacts have similar symptoms.  She has not had any other recent travel.   Tubular adenoma on 06/2012 colonoscopy with recs for repeat colonoscopy in 5 years. Plan:  Refer to GI for EGD.  She was provided with containers to collect stool sample (she will return it next week) - plan to send for stool culture, h pylori stool ag assay (she has not take PPI in 3 months), O&P.  CMP and CBC today.  Follow-up in clinic in 1 month.

## 2015-12-01 NOTE — Assessment & Plan Note (Addendum)
Assessment:  She continues to have epigastric pain x 4 months despite being off NSAIDs and 4 week trial of PPI.  GERD symptoms seem to have resolved and she has no odynophagia, dysphagia or weight loss, however would recommend EGD given her age and persistent symptoms despite PPI.  No hx of EGD.   Plan:  Refer to GI for EGD.  She was provided with containers to collect stool sample (she will return it next week) - plan to send for stool culture, h pylori stool ag assay (she has not take PPI in 3 months), O&P.  CMP and CBC today.  Follow-up in clinic in 1 month.

## 2015-12-02 LAB — URINE CULTURE

## 2015-12-02 NOTE — Progress Notes (Signed)
Internal Medicine Clinic Attending  Case discussed with Dr. Wilson soon after the resident saw the patient.  We reviewed the resident's history and exam and pertinent patient test results.  I agree with the assessment, diagnosis, and plan of care documented in the resident's note.  

## 2015-12-04 ENCOUNTER — Other Ambulatory Visit: Payer: Self-pay | Admitting: Internal Medicine

## 2015-12-04 DIAGNOSIS — N3000 Acute cystitis without hematuria: Secondary | ICD-10-CM

## 2015-12-04 MED ORDER — NITROFURANTOIN MONOHYD MACRO 100 MG PO CAPS: 100.0000 mg | ORAL_CAPSULE | Freq: Two times a day (BID) | ORAL | Status: DC

## 2015-12-05 ENCOUNTER — Other Ambulatory Visit: Payer: Medicaid Other

## 2015-12-05 DIAGNOSIS — K529 Noninfective gastroenteritis and colitis, unspecified: Secondary | ICD-10-CM

## 2015-12-07 LAB — H. PYLORI ANTIGEN, STOOL: H pylori Ag, Stl: NEGATIVE

## 2015-12-12 LAB — CDIFF NAA+O+P+STOOL CULTURE
CDIFFPCR: NEGATIVE
E COLI SHIGA TOXIN ASSAY: NEGATIVE

## 2016-01-25 ENCOUNTER — Encounter: Payer: Self-pay | Admitting: *Deleted

## 2016-02-06 ENCOUNTER — Ambulatory Visit (INDEPENDENT_AMBULATORY_CARE_PROVIDER_SITE_OTHER): Payer: Medicaid Other | Admitting: Internal Medicine

## 2016-02-06 ENCOUNTER — Other Ambulatory Visit (INDEPENDENT_AMBULATORY_CARE_PROVIDER_SITE_OTHER): Payer: Medicaid Other

## 2016-02-06 ENCOUNTER — Encounter: Payer: Self-pay | Admitting: Internal Medicine

## 2016-02-06 VITALS — BP 92/60 | HR 68 | Ht <= 58 in | Wt 101.0 lb

## 2016-02-06 DIAGNOSIS — Z86010 Personal history of colonic polyps: Secondary | ICD-10-CM | POA: Diagnosis not present

## 2016-02-06 DIAGNOSIS — R195 Other fecal abnormalities: Secondary | ICD-10-CM

## 2016-02-06 DIAGNOSIS — R1013 Epigastric pain: Secondary | ICD-10-CM | POA: Diagnosis not present

## 2016-02-06 DIAGNOSIS — R110 Nausea: Secondary | ICD-10-CM

## 2016-02-06 LAB — COMPREHENSIVE METABOLIC PANEL
ALBUMIN: 4.4 g/dL (ref 3.5–5.2)
ALK PHOS: 71 U/L (ref 39–117)
ALT: 29 U/L (ref 0–35)
AST: 33 U/L (ref 0–37)
BILIRUBIN TOTAL: 0.3 mg/dL (ref 0.2–1.2)
BUN: 21 mg/dL (ref 6–23)
CALCIUM: 9.3 mg/dL (ref 8.4–10.5)
CHLORIDE: 102 meq/L (ref 96–112)
CO2: 33 mEq/L — ABNORMAL HIGH (ref 19–32)
CREATININE: 1.1 mg/dL (ref 0.40–1.20)
GFR: 53.44 mL/min — ABNORMAL LOW (ref >60.00)
Glucose, Bld: 83 mg/dL (ref 70–99)
Potassium: 4.5 mEq/L (ref 3.5–5.1)
SODIUM: 141 meq/L (ref 135–145)
TOTAL PROTEIN: 7.8 g/dL (ref 6.0–8.3)

## 2016-02-06 LAB — IGA: IGA: 257 mg/dL (ref 68–378)

## 2016-02-06 NOTE — Patient Instructions (Addendum)
Your physician has requested that you go to the basement for the following lab work before leaving today: CMP, Celiac Panel  You have been scheduled for a CT scan of the abdomen and pelvis at [facility] ([address]---this is in the same building as Press photographer).   You are scheduled on Monday, 02/12/16 at 2:00 pm. You should arrive 15 minutes prior to your appointment time for registration. Please follow the written instructions below on the day of your exam:  WARNING: IF YOU ARE ALLERGIC TO IODINE/X-RAY DYE, PLEASE NOTIFY RADIOLOGY IMMEDIATELY AT [phone]! YOU WILL BE GIVEN A 13 HOUR PREMEDICATION PREP.  1) Do not eat or drink anything after 11:30 am (4 hours prior to your test) 2) You have been given 2 bottles of oral contrast to drink. The solution may taste better if refrigerated, but do NOT add ice or any other liquid to this solution. Shake well before drinking.    Drink 1 bottle of contrast @ 1:30 pm (2 hours prior to your exam)  Drink 1 bottle of contrast @ 2:30 pm (1 hour prior to your exam)  You may take any medications as prescribed with a small amount of water except for the following: Metformin, Glucophage, Glucovance, Avandamet, Riomet, Fortamet, Actoplus Met, Janumet, Glumetza or Metaglip. The above medications must be held the day of the exam AND 48 hours after the exam.  The purpose of you drinking the oral contrast is to aid in the visualization of your intestinal tract. The contrast solution may cause some diarrhea. Before your exam is started, you will be given a small amount of fluid to drink. Depending on your individual set of symptoms, you may also receive an intravenous injection of x-ray contrast/dye. Plan on being at [facility] for 30 minutes or longer, depending on the type of exam you are having performed.  This test typically takes 30-45 minutes to complete.  If you have any questions regarding your exam or if you need to  reschedule, you may call the CT department at [phone] between the hours of 8:00 am and 5:00 pm, Monday-Friday.  ________________________________________________________________________  If you are age 16 or older, your body mass index should be between 23-30. Your Body mass index is 23.47 kg/m. If this is out of the aforementioned range listed, please consider follow up with your Primary Care Provider.  If you are age 80 or younger, your body mass index should be between 19-25. Your Body mass index is 23.47 kg/m. If this is out of the aformentioned range listed, please consider follow up with your Primary Care Provider.

## 2016-02-06 NOTE — Progress Notes (Signed)
Patient ID: Dana Schultz, female   DOB: 11-Mar-1956, 60 y.o.   MRN: [id number] HPI: Dana Schultz is a 60 year old [place] female with a past medical history of adenomatous colon polyps, diverticulosis and arthritis who is seen in consultation at the request of Duwaine Maxin, D.O. to evaluate epigastric abdominal pain and loose stools. She is here today with a medical [place] interpreter. She is known to me from screening colonoscopy performed on 06/22/2012. This revealed 2 polyps in the ascending and sigmoid colon which were removed. There was mild diverticulosis in the ascending colon. He's are found to be tubular adenoma without high-grade dysplasia.  She was seen by primary care in June and noted to have watery diarrhea. Infectious workup was performed and negative. H. pylori was checked by stool antigen and negative. She was having persistent issues with abdominal/epigastric pain and was referred here. She was also given pantoprazole 40 mg daily.  She states that her epigastric pain improved with pantoprazole but still is bothering her "once and a while". Seems to be worse 1-2 hours after eating and is associated with nausea but not vomiting. She denies significant weight loss but states her weight says "fluctuated". She denies early satiety and reports recently her appetite has been "better". She was having more loose stools but today she reports her stools have returned to more normal. She goes 2-3 times per day, usually after eating. She has run out of pantoprazole.  In mid June 2017 CMP was within normal limits except for a mild elevation in an ion gap. Blood counts were normal. H. pylori stool antigen negative. Ova and parasites stool negative.   She denies family history of GI tract malignancy in IBD.  Past Medical History:  Diagnosis Date  . Cervical spondylosis with radiculopathy 05/01/2012  . Cystitis 05/01/2012  . Diverticulosis   . Mild acid reflux 05/01/2012  . Rotator cuff arthropathy  05/01/2012  . Tubular adenoma     Past Surgical History:  Procedure Laterality Date  . OVARIAN CYST SURGERY  1997    Outpatient Medications Prior to Visit  Medication Sig Dispense Refill  . pantoprazole (PROTONIX) 40 MG tablet Take 1 tablet (40 mg total) by mouth daily. 90 tablet 3  . diclofenac sodium (VOLTAREN) 1 % GEL Apply 2 g topically 4 (four) times daily. 100 g 3  . nitrofurantoin, macrocrystal-monohydrate, (MACROBID) 100 MG capsule Take 1 capsule (100 mg total) by mouth 2 (two) times daily. 10 capsule 0  . zoster vaccine live, PF, (ZOSTAVAX) 91478 UNT/0.65ML injection Inject 19,400 Units into the skin once. 1 each 0   No facility-administered medications prior to visit.     No Known Allergies  Family History  Problem Relation Age of Onset  . Cancer Neg Hx   . Heart disease Neg Hx   . Stroke Neg Hx   . Colon cancer Neg Hx   . Stomach cancer Neg Hx     Social History  Substance Use Topics  . Smoking status: Never Smoker  . Smokeless tobacco: Never Used  . Alcohol use No    Keel: As per history of present illness, otherwise negative  BP 92/60 (BP Location: Left Arm, Patient Position: Sitting, Cuff Size: Normal)   Pulse 68   Ht 4\' 7"  (1.397 m)   Wt 101 lb (45.8 kg)   BMI 23.47 kg/m  Constitutional: Well-developed and well-nourished. No distress. HEENT: Normocephalic and atraumatic. Oropharynx is clear and moist. No oropharyngeal exudate. Conjunctivae are normal.  No scleral icterus.  Neck: Neck supple. Trachea midline. Cardiovascular: Normal rate, regular rhythm and intact distal pulses. No M/R/G Pulmonary/chest: Effort normal and breath sounds normal. No wheezing, rales or rhonchi. Abdominal: Soft, nontender, nondistended. Bowel sounds active throughout.  Extremities: no clubbing, cyanosis, or edema Lymphadenopathy: No cervical adenopathy noted. Neurological: Alert and oriented to person place and time. Skin: Skin is warm and dry. No rashes  noted. Psychiatric: Normal mood and affect. Behavior is normal.  RELEVANT LABS AND IMAGING: CBC    Component Value Date/Time   WBC 7.0 11/30/2015 1631   WBC 6.9 11/16/2014 1441   RBC 4.80 11/30/2015 1631   RBC 4.73 11/16/2014 1441   HGB 13.2 11/16/2014 1441   HCT 41.0 11/30/2015 1631   PLT 257 11/30/2015 1631   MCV 85 11/30/2015 1631   MCH 28.1 11/30/2015 1631   MCH 27.9 11/16/2014 1441   MCHC 32.9 11/30/2015 1631   MCHC 32.7 11/16/2014 1441   RDW 13.9 11/30/2015 1631   LYMPHSABS 1.6 08/14/2015 1616   EOSABS 0.1 08/14/2015 1616   BASOSABS 0.0 08/14/2015 1616    CMP     Component Value Date/Time   NA 141 02/06/2016 1527   NA 142 11/30/2015 1631   K 4.5 02/06/2016 1527   CL 102 02/06/2016 1527   CO2 33 (H) 02/06/2016 1527   GLUCOSE 83 02/06/2016 1527   BUN 21 02/06/2016 1527   BUN 15 11/30/2015 1631   CREATININE 1.10 02/06/2016 1527   CREATININE 1.08 11/16/2014 1441   CALCIUM 9.3 02/06/2016 1527   PROT 7.8 02/06/2016 1527   PROT 8.3 11/30/2015 1631   ALBUMIN 4.4 02/06/2016 1527   ALBUMIN 4.7 11/30/2015 1631   AST 33 02/06/2016 1527   ALT 29 02/06/2016 1527   ALKPHOS 71 02/06/2016 1527   BILITOT 0.3 02/06/2016 1527   BILITOT 0.3 11/30/2015 1631   GFRNONAA 61 11/30/2015 1631   GFRNONAA 56 (L) 11/16/2014 1441   GFRAA 70 11/30/2015 1631   GFRAA 64 11/16/2014 1441    ASSESSMENT/PLAN:  60 year old [place] female with a past medical history of adenomatous colon polyps, diverticulosis and arthritis who is seen in consultation at the request of Duwaine Maxin, D.O. to evaluate epigastric abdominal pain and loose stools.  1. Epigastric pain with nausea -- some improvement with pantoprazole argues for acid peptic etiology. However response was incomplete. I recommended resuming pantoprazole 40 mg daily. I recommended upper endoscopy initially but she would prefer imaging first. She is nervous about endoscopy. I could not elicit any specific fears on questioning. Will  proceed with CT scan of the abdomen and pelvis with contrast. If negative my recommendation is for EGD.  2. Loose stools -- negative infectious workup. Albumin is normal. Colonoscopy is up-to-date. She is reporting loose stools have resolved. No further evaluation at present for this issue  3. History of adenomatous colon polyp -- repeat colonoscopy in 5 year interval which would be January 2019. She was reminded of this previous recommendation today.    [id number] Ronnie [place], Do No address on file

## 2016-02-07 LAB — TISSUE TRANSGLUTAMINASE, IGA: TISSUE TRANSGLUTAMINASE AB, IGA: 1 U/mL (ref <4)

## 2016-02-08 ENCOUNTER — Other Ambulatory Visit: Payer: Medicaid Other

## 2016-02-12 ENCOUNTER — Ambulatory Visit (INDEPENDENT_AMBULATORY_CARE_PROVIDER_SITE_OTHER)
Admission: RE | Admit: 2016-02-12 | Discharge: 2016-02-12 | Disposition: A | Discharge status: Home / Self Care | Payer: Medicaid Other | Source: Ambulatory Visit | Attending: Internal Medicine | Admitting: Internal Medicine

## 2016-02-12 DIAGNOSIS — R1013 Epigastric pain: Secondary | ICD-10-CM

## 2016-02-12 DIAGNOSIS — R110 Nausea: Secondary | ICD-10-CM

## 2016-02-12 MED ORDER — IOPAMIDOL (ISOVUE-300) INJECTION 61%
100.0000 mL | Freq: Once | INTRAVENOUS | Status: AC | PRN
  Administered 2016-02-12: 80 mL via INTRAVENOUS

## 2016-10-31 ENCOUNTER — Ambulatory Visit (INDEPENDENT_AMBULATORY_CARE_PROVIDER_SITE_OTHER): Payer: Medicaid Other | Admitting: Internal Medicine

## 2016-10-31 ENCOUNTER — Encounter (INDEPENDENT_AMBULATORY_CARE_PROVIDER_SITE_OTHER): Payer: Self-pay

## 2016-10-31 VITALS — BP 126/71 | HR 51 | Temp 97.5°F | Ht <= 58 in | Wt 103.5 lb

## 2016-10-31 DIAGNOSIS — E785 Hyperlipidemia, unspecified: Secondary | ICD-10-CM

## 2016-10-31 DIAGNOSIS — E782 Mixed hyperlipidemia: Secondary | ICD-10-CM

## 2016-10-31 DIAGNOSIS — K219 Gastro-esophageal reflux disease without esophagitis: Secondary | ICD-10-CM | POA: Diagnosis not present

## 2016-10-31 MED ORDER — RANITIDINE HCL 150 MG PO TABS: 150.0000 mg | ORAL_TABLET | Freq: Two times a day (BID) | ORAL | 6 refills | Status: DC | PRN

## 2016-10-31 NOTE — Progress Notes (Signed)
   CC: follow up for GERD HPI: Ms. Dana Schultz is a 61 y.o. female with a h/o of GERD and tubular adenoma of colon s/p resection on colonoscopy who presents with complaint of having run out of her medications and worsening of belly pain.  Please see Problem-based charting for HPI and the status of patient's chronic medical conditions.  Past Medical History:  Diagnosis Date  . Cervical spondylosis with radiculopathy 05/01/2012  . Cystitis 05/01/2012  . Diverticulosis   . Mild acid reflux 05/01/2012  . Rotator cuff arthropathy 05/01/2012  . Tubular adenoma    Social History  Substance Use Topics  . Smoking status: Never Smoker  . Smokeless tobacco: Never Used  . Alcohol use No   Family History  Problem Relation Age of Onset  . Cancer Neg Hx   . Heart disease Neg Hx   . Stroke Neg Hx   . Colon cancer Neg Hx   . Stomach cancer Neg Hx    Review of Systems: Horine in HPI. Otherwise: Review of Systems  Constitutional: Negative for chills, fever and weight loss.  Respiratory: Negative for cough and shortness of breath.   Cardiovascular: Negative for chest pain and leg swelling.  Gastrointestinal: Positive for abdominal pain and heartburn. Negative for constipation, diarrhea, nausea and vomiting.  Genitourinary: Negative for dysuria, frequency and urgency.   Physical Exam: Vitals:   10/31/16 1528  BP: 140/74  Pulse: 71  Temp: 97.5 F (36.4 C)  TempSrc: Oral  SpO2: 99%  Weight: 103 lb 8 oz (46.9 kg)  Height: 4\' 7"  (1.397 m)   Physical Exam  Constitutional: She appears well-developed. She is cooperative. No distress.  Cardiovascular: Normal rate, regular rhythm, normal heart sounds and normal pulses.  Exam reveals no gallop.   No murmur heard. Pulmonary/Chest: Effort normal and breath sounds normal. No respiratory distress. She has no wheezes. She has no rhonchi. She has no rales. Breasts are symmetrical.  Abdominal: Soft. Bowel sounds are normal. There is no tenderness.    Musculoskeletal: She exhibits no edema.    Assessment & Plan:  See encounters tab for problem based medical decision making. Patient discussed with Dr. Daryll Drown  Hyperlipidemia Pt has a h/o elevated total cholesterol and LDL on last lipid panel in 2015. However, given that lack of other risk factors no pharmacotherapy was indicated. Given her history, I think it is reasonable to repeat a lipid panel today to monitor for change and to reassess her ASCVD risk.  Assessment: Mixed hyperlipidemia w/ 4.7% 10 year ASCVD risk score.  Plan: Repeat Lipid panel today.  GERD (gastroesophageal reflux disease) Pt has a h/o GERD and was last scene in our [facility] 1 year ago with c/o epigastric pain. She was referred to GI at that time who recommended EGD, but pt declined and preferred imaging w/ CT A/P which was negative. Stool studies for H. Pylori where also negative. Pt returns having run out of her PPI medication w/ complaint of worsened belly pain.  Assessment: Uncontrolled GERD  Plan: Refill pantoprazole 40mg  qD.   Signed: Holley Raring, MD 10/31/2016, 3:58 PM  Pager: [phone]

## 2016-10-31 NOTE — Assessment & Plan Note (Signed)
Pt has a h/o elevated total cholesterol and LDL on last lipid panel in 2015. However, given that lack of other risk factors no pharmacotherapy was indicated. Given her history, I think it is reasonable to repeat a lipid panel today to monitor for change and to reassess her ASCVD risk.  Assessment: Mixed hyperlipidemia w/ 4.7% 10 year ASCVD risk score.  Plan: Repeat Lipid panel today.

## 2016-10-31 NOTE — Patient Instructions (Signed)
I have prescribed a medication for your acid reflux, which I think is causing your belly pain. This medication is safer than the one you were using previously and will work more quickly when you have pain. You can use this anytime you have this belly discomfort up to two time every day.  I will also send you the results of your cholesterol blood test today.

## 2016-10-31 NOTE — Assessment & Plan Note (Signed)
Pt has a h/o GERD and was last scene in our [facility] 1 year ago with c/o epigastric pain. She was referred to GI at that time who recommended EGD, but pt declined and preferred imaging w/ CT A/P which was negative. Stool studies for H. Pylori where also negative. Pt returns having run out of her PPI medication w/ complaint of worsened belly pain.  Assessment: Uncontrolled GERD  Plan: Refill pantoprazole 40mg  qD.

## 2016-11-01 ENCOUNTER — Encounter: Payer: Self-pay | Admitting: Internal Medicine

## 2016-11-01 LAB — LIPID PANEL
Chol/HDL Ratio: 5.1 ratio — ABNORMAL HIGH (ref 0.0–4.4)
Cholesterol, Total: 261 mg/dL — ABNORMAL HIGH (ref 100–199)
HDL: 51 mg/dL (ref >39)
LDL CALC: 172 mg/dL — AB (ref 0–99)
Triglycerides: 189 mg/dL — ABNORMAL HIGH (ref 0–149)
VLDL CHOLESTEROL CAL: 38 mg/dL (ref 5–40)

## 2016-11-01 NOTE — Progress Notes (Signed)
Internal Medicine Clinic Attending  Case discussed with Dr. Strelow at the time of the visit.  We reviewed the resident's history and exam and pertinent patient test results.  I agree with the assessment, diagnosis, and plan of care documented in the resident's note.  

## 2016-11-22 ENCOUNTER — Encounter: Payer: Self-pay | Admitting: *Deleted

## 2017-06-17 DIAGNOSIS — H269 Unspecified cataract: Secondary | ICD-10-CM

## 2017-06-17 HISTORY — DX: Unspecified cataract: H26.9

## 2017-06-18 ENCOUNTER — Ambulatory Visit: Payer: Medicaid Other | Admitting: Internal Medicine

## 2017-06-18 ENCOUNTER — Other Ambulatory Visit: Payer: Self-pay

## 2017-06-18 ENCOUNTER — Encounter: Payer: Self-pay | Admitting: Internal Medicine

## 2017-06-18 ENCOUNTER — Encounter (INDEPENDENT_AMBULATORY_CARE_PROVIDER_SITE_OTHER): Payer: Self-pay

## 2017-06-18 ENCOUNTER — Telehealth: Payer: Self-pay | Admitting: *Deleted

## 2017-06-18 VITALS — BP 127/65 | HR 88 | Temp 98.0°F | Ht <= 58 in | Wt 102.8 lb

## 2017-06-18 DIAGNOSIS — K219 Gastro-esophageal reflux disease without esophagitis: Secondary | ICD-10-CM

## 2017-06-18 MED ORDER — PANTOPRAZOLE SODIUM 40 MG PO TBEC: 40.0000 mg | DELAYED_RELEASE_TABLET | Freq: Every day | ORAL | 3 refills | Status: DC

## 2017-06-18 NOTE — Patient Instructions (Addendum)
Nice to meet you today Dana Schultz  Take Pantoprazole (protonix) 40 mg each day to help with your stomach pain. We also put in a referral to see the stomach doctor in case you need to have your stomach and esophagus examined with a camera, similar to a colonscopy.

## 2017-06-18 NOTE — Progress Notes (Signed)
   CC: Epigastric Pain   HPI:  Dana Schultz is a 62 y.o. female with past medical history as described below who presents to the clinic with complaints of epigastric pain. History obtained with assistance of interpreter.   Epigastric Pain: She reports a 2-51-month history of intermittent epigastric pain described as burning sensation which is worse after eating and relieved with burping or bowel movement.  She notes associated nausea, denies vomiting, dysphagia, weight loss.  This pain occurs about 3 times per week.  She has a history of similar symptoms which she has been on a medication for though she states she ran out several days ago and requests a refill.  She denies shortness of breath, diaphoresis, or other associated symptoms.  Past Medical History:  Diagnosis Date  . Cervical spondylosis with radiculopathy 05/01/2012  . Cystitis 05/01/2012  . Diverticulosis   . Mild acid reflux 05/01/2012  . Rotator cuff arthropathy 05/01/2012  . Tubular adenoma    Review of Systems:  Review of Systems  Constitutional: Negative for weight loss.  Respiratory: Negative for shortness of breath.   Gastrointestinal: Positive for abdominal pain and heartburn. Negative for vomiting.     Physical Exam:  Vitals:   06/18/17 1537  BP: 127/65  Pulse: 88  Temp: 98 F (36.7 C)  TempSrc: Oral  SpO2: 100%  Weight: 102 lb 12.8 oz (46.6 kg)  Height: 4\' 7"  (1.397 m)   General: Sitting in chair comfortably, no acute distress CV: Regular rate and rhythm, no murmurs appreciated Resp: Clear breath sounds bilaterally, normal work of breathing, no distress  Abd: Soft, +BS, no tenderness to palpation at time of exam, no mass appreciated   Neuro: Alert and oriented x3 Skin: Warm, dry      Assessment & Plan:   See Encounters Tab for problem based charting.  Patient discussed with Dr. Evette Doffing

## 2017-06-18 NOTE — Assessment & Plan Note (Signed)
The patient has a history of GERD presenting with similar pain and symptoms.  She is previously been referred to GI who recommended EGD but preferred imaging and stool studies which at that time were negative.  Notes indicate she was previously prescribed pantoprazole 40 mg daily, however medication list currently has Zantac listed.  Today, she is open to gastroenterology referral and was counseled that EGD may be helpful depending on their evaluation.  Her age and Asian background are risk factors for more serious underlying causes for her GERD, though she currently does not have red flag symptoms such as weight loss, dysphagia.  --Refill Pantoprazole 40 mg daily, updated med list  --GI Referral

## 2017-06-18 NOTE — Telephone Encounter (Signed)
I agree.. Will evaluate her this afternoon

## 2017-06-18 NOTE — Telephone Encounter (Signed)
Called from patient's niece stating patient has been having intermittent chest pains for 2-3 weeks. Pain is worse when lifting something accompanied by numbness of hands. Patient lives in apartment & niece works all the time. Advised to take patient to ER but not possible @ this time. Stated " my aunt is fine, not serious she just needs to get checked out." Last OV 10/31/16. App tin [facility] scheduled today @ 3:15.

## 2017-06-19 NOTE — Progress Notes (Signed)
Internal Medicine Clinic Attending  Case discussed with Dr. Harden at the time of the visit.  We reviewed the resident's history and exam and pertinent patient test results.  I agree with the assessment, diagnosis, and plan of care documented in the resident's note.  

## 2017-06-20 ENCOUNTER — Other Ambulatory Visit: Payer: Self-pay | Admitting: Internal Medicine

## 2017-06-20 DIAGNOSIS — Z0000 Encounter for general adult medical examination without abnormal findings: Secondary | ICD-10-CM

## 2017-06-20 NOTE — Progress Notes (Unsigned)
Mammogram ordered

## 2017-07-17 ENCOUNTER — Ambulatory Visit: Payer: Medicaid Other

## 2017-08-04 ENCOUNTER — Ambulatory Visit
Admission: RE | Admit: 2017-08-04 | Discharge: 2017-08-04 | Disposition: A | Discharge status: Home / Self Care | Payer: Medicaid Other | Source: Ambulatory Visit | Attending: Internal Medicine | Admitting: Internal Medicine

## 2017-08-04 DIAGNOSIS — Z0000 Encounter for general adult medical examination without abnormal findings: Secondary | ICD-10-CM

## 2017-08-18 ENCOUNTER — Ambulatory Visit: Payer: Medicaid Other | Admitting: Internal Medicine

## 2017-08-18 ENCOUNTER — Encounter: Payer: Self-pay | Admitting: Internal Medicine

## 2017-08-18 VITALS — BP 110/62 | HR 92 | Ht <= 58 in | Wt 101.0 lb

## 2017-08-18 DIAGNOSIS — R1013 Epigastric pain: Secondary | ICD-10-CM

## 2017-08-18 DIAGNOSIS — Z86010 Personal history of colonic polyps: Secondary | ICD-10-CM | POA: Diagnosis not present

## 2017-08-18 MED ORDER — SUPREP BOWEL PREP KIT 17.5-3.13-1.6 GM/177ML PO SOLN: 1.0000 | ORAL | 0 refills | Status: DC

## 2017-08-18 NOTE — Progress Notes (Signed)
Patient ID: Dana Schultz, female   DOB: 1955/08/16, 62 y.o.   MRN: [id number] HPI: Dana Schultz is a 62 year old [place] female with a history of adenomatous colon polyps, colonic diverticulosis, arthritis who is seen in follow-up at the request of her primary provider to evaluate ongoing epigastric burning pain.  She is here with her granddaughter who also helped with translation.  He was last seen in the office on 02/06/2016 to evaluate epigastric pain with nausea.  His had improved somewhat but incompletely with pantoprazole.  We restarted pantoprazole 64 mg and I recommended upper endoscopy.  She preferred imaging first and thus a CT scan of the abdomen pelvis was performed.  CT scan was unremarkable.  She never returned for endoscopy.  She states she continues the pantoprazole 40 mg daily.  She continues to have burning epigastric pain and belching.  This is worse at night.  She also feels an upper abdominal bloating.  Worse after eating.  Mild nausea.  No vomiting.  No dysphagia or odynophagia.  Bowel movements regular without change.  Denies diarrhea and constipation.  Bowel movements occur about twice daily.  Her last colonoscopy was 5 years ago where she had adenomatous polyp removed.  Past Medical History:  Diagnosis Date  . Cervical spondylosis with radiculopathy 05/01/2012  . Cystitis 05/01/2012  . Diverticulosis   . Mild acid reflux 05/01/2012  . Rotator cuff arthropathy 05/01/2012  . Tubular adenoma     Past Surgical History:  Procedure Laterality Date  . OVARIAN CYST SURGERY  1997    Outpatient Medications Prior to Visit  Medication Sig Dispense Refill  . pantoprazole (PROTONIX) 40 MG tablet Take 1 tablet (40 mg total) by mouth daily. 90 tablet 3   No facility-administered medications prior to visit.     No Known Allergies  Family History  Problem Relation Age of Onset  . Cancer Neg Hx   . Heart disease Neg Hx   . Stroke Neg Hx   . Colon cancer Neg Hx   . Stomach cancer  Neg Hx     Social History   Tobacco Use  . Smoking status: Never Smoker  . Smokeless tobacco: Never Used  Substance Use Topics  . Alcohol use: No    Alcohol/week: 0.0 oz  . Drug use: No    [place]: As per history of present illness, otherwise negative  BP 110/62   Pulse 92   Ht 4\' 7"  (1.397 m)   Wt 101 lb (45.8 kg)   BMI 23.47 kg/m  Constitutional: Well-developed and well-nourished. No distress. HEENT: Normocephalic and atraumatic. Oropharynx is clear and moist. Conjunctivae are normal.  No scleral icterus. Neck: Neck supple. Trachea midline. Cardiovascular: Normal rate, regular rhythm and intact distal pulses. No M/R/G Pulmonary/chest: Effort normal and breath sounds normal. No wheezing, rales or rhonchi. Abdominal: Soft, nontender, nondistended. Bowel sounds active throughout. There are no masses palpable. No hepatosplenomegaly. Extremities: no clubbing, cyanosis, or edema Neurological: Alert and oriented to person place and time. Skin: Skin is warm and dry.  Psychiatric: Normal mood and affect. Behavior is normal.  RELEVANT LABS AND IMAGING: CBC    Component Value Date/Time   WBC 7.0 11/30/2015 1631   WBC 6.9 11/16/2014 1441   RBC 4.80 11/30/2015 1631   RBC 4.73 11/16/2014 1441   HGB 13.5 11/30/2015 1631   HCT 41.0 11/30/2015 1631   PLT 257 11/30/2015 1631   MCV 85 11/30/2015 1631   MCH 28.1 11/30/2015 1631   MCH 27.9 11/16/2014  1441   MCHC 32.9 11/30/2015 1631   MCHC 32.7 11/16/2014 1441   RDW 13.9 11/30/2015 1631   LYMPHSABS 1.6 08/14/2015 1616   EOSABS 0.1 08/14/2015 1616   BASOSABS 0.0 08/14/2015 1616    CMP     Component Value Date/Time   NA 141 02/06/2016 1527   NA 142 11/30/2015 1631   K 4.5 02/06/2016 1527   CL 102 02/06/2016 1527   CO2 33 (H) 02/06/2016 1527   GLUCOSE 83 02/06/2016 1527   BUN 21 02/06/2016 1527   BUN 15 11/30/2015 1631   CREATININE 1.10 02/06/2016 1527   CREATININE 1.08 11/16/2014 1441   CALCIUM 9.3 02/06/2016 1527   PROT  7.8 02/06/2016 1527   PROT 8.3 11/30/2015 1631   ALBUMIN 4.4 02/06/2016 1527   ALBUMIN 4.7 11/30/2015 1631   AST 33 02/06/2016 1527   ALT 29 02/06/2016 1527   ALKPHOS 71 02/06/2016 1527   BILITOT 0.3 02/06/2016 1527   BILITOT 0.3 11/30/2015 1631   GFRNONAA 61 11/30/2015 1631   GFRNONAA 56 (L) 11/16/2014 1441   GFRAA 70 11/30/2015 1631   GFRAA 64 11/16/2014 1441   CT ABDOMEN AND PELVIS WITH CONTRAST   TECHNIQUE: Multidetector CT imaging of the abdomen and pelvis was performed using the standard protocol following bolus administration of intravenous contrast.   CONTRAST:  36mL ISOVUE-300 IOPAMIDOL (ISOVUE-300) INJECTION 61%   COMPARISON:  None.   FINDINGS: Lower chest: No significant pulmonary nodules or acute consolidative airspace disease. Partially visualized coarsely calcified bilateral hilar nodes, consistent with prior granulomatous disease.   Hepatobiliary: Normal liver with no liver mass. Normal gallbladder with no radiopaque cholelithiasis. No biliary ductal dilatation.   Pancreas: Normal, with no mass or duct dilation.   Spleen: Normal size. No mass.   Adrenals/Urinary Tract: Normal adrenals. No hydronephrosis. Hypodense 0.3 cm renal cortical lesion in the lateral interpolar left kidney is too small to characterize and requires no further follow-up. No additional renal lesions. Normal bladder.   Stomach/Bowel: Grossly normal stomach. Normal caliber small bowel with no small bowel wall thickening. Appendix is either diminutive or surgically absent. No pericecal inflammatory changes. Normal large bowel with no diverticulosis, large bowel wall thickening or pericolonic fat stranding.   Vascular/Lymphatic: Normal caliber abdominal aorta. Patent portal, splenic, hepatic and renal veins. No pathologically enlarged lymph nodes in the abdomen or pelvis.   Reproductive: Grossly normal uterus.  No adnexal mass.   Other: No pneumoperitoneum, ascites or focal fluid  collection.   Musculoskeletal: No aggressive appearing focal osseous lesions. Small nonspecific sclerotic lesions in the inferior left pubic ramus are favored to represent benign bone islands. Mild thoracolumbar spondylosis.   IMPRESSION: No acute abnormality. No evidence of bowel obstruction or acute bowel inflammation.     Electronically Signed   By: Ilona Sorrel M.D.   On: 02/12/2016 15:19  ASSESSMENT/PLAN: 62 year old [place] female with a history of adenomatous colon polyps, colonic diverticulosis, arthritis who is seen in follow-up at the request of her primary provider to evaluate ongoing epigastric burning pain.   1.  Epigastric pain --despite PPI.  Upper endoscopy recommended.  We discussed the risk, benefits and alternatives and she is agreeable and wishes to proceed.  Continue pantoprazole 40 mg daily for now  2.  History of adenomatous colon polyp --surveillance colonoscopy due this year.  This can be performed on the same day as her upper endoscopy.  We reviewed the risk, benefits and alternatives and she is agreeable and wishes to proceed  67  minutes spent with the patient today. Greater than 50% was spent in counseling and coordination of care with the patient  Cc:Katherine Roan, Md [address] [place], [place] [address]

## 2017-08-18 NOTE — Patient Instructions (Signed)
You have been scheduled for an endoscopy and colonoscopy. Please follow the written instructions given to you at your visit today. Please pick up your prep supplies at the pharmacy within the next 1-3 days. If you use inhalers (even only as needed), please bring them with you on the day of your procedure. Your physician has requested that you go to www.startemmi.com and enter the access code given to you at your visit today. This web site gives a general overview about your procedure. However, you should still follow specific instructions given to you by our office regarding your preparation for the procedure.  If you are age 64 or older, your body mass index should be between 23-30. Your Body mass index is 23.47 kg/m. If this is out of the aforementioned range listed, please consider follow up with your Primary Care Provider.  If you are age 85 or younger, your body mass index should be between 19-25. Your Body mass index is 23.47 kg/m. If this is out of the aformentioned range listed, please consider follow up with your Primary Care Provider.

## 2017-09-04 ENCOUNTER — Encounter: Payer: Self-pay | Admitting: Internal Medicine

## 2017-09-04 ENCOUNTER — Ambulatory Visit (AMBULATORY_SURGERY_CENTER): Payer: Medicaid Other | Admitting: Internal Medicine

## 2017-09-04 ENCOUNTER — Other Ambulatory Visit: Payer: Self-pay

## 2017-09-04 VITALS — BP 110/66 | HR 77 | Temp 96.9°F | Resp 16 | Ht <= 58 in | Wt 101.0 lb

## 2017-09-04 DIAGNOSIS — D125 Benign neoplasm of sigmoid colon: Secondary | ICD-10-CM

## 2017-09-04 DIAGNOSIS — D123 Benign neoplasm of transverse colon: Secondary | ICD-10-CM

## 2017-09-04 DIAGNOSIS — R1013 Epigastric pain: Secondary | ICD-10-CM | POA: Diagnosis not present

## 2017-09-04 DIAGNOSIS — Z860100 Personal history of colon polyps, unspecified: Secondary | ICD-10-CM

## 2017-09-04 DIAGNOSIS — K263 Acute duodenal ulcer without hemorrhage or perforation: Secondary | ICD-10-CM | POA: Diagnosis not present

## 2017-09-04 MED ORDER — SODIUM CHLORIDE 0.9 % IV SOLN: 500.0000 mL | Freq: Once | INTRAVENOUS | Status: AC

## 2017-09-04 NOTE — Progress Notes (Signed)
A and O x3. Report to RN. Tolerated MAC anesthesia well.Teeth unchanged after procedure.

## 2017-09-04 NOTE — Patient Instructions (Signed)
YOU HAD AN ENDOSCOPIC PROCEDURE TODAY: Refer to the procedure report and other information in the discharge instructions given to you for any specific questions about what was found during the examination. If this information does not answer your questions, please call [facility] office at [phone] to clarify.  ° °YOU SHOULD EXPECT: Some feelings of bloating in the abdomen. Passage of more gas than usual. Walking can help get rid of the air that was put into your GI tract during the procedure and reduce the bloating. If you had a lower endoscopy (such as a colonoscopy or flexible sigmoidoscopy) you may notice spotting of blood in your stool or on the toilet paper. Some abdominal soreness may be present for a day or two, also. ° °DIET: Your first meal following the procedure should be a light meal and then it is ok to progress to your normal diet. A half-sandwich or bowl of soup is an example of a good first meal. Heavy or fried foods are harder to digest and may make you feel nauseous or bloated. Drink plenty of fluids but you should avoid alcoholic beverages for 24 hours. If you had a esophageal dilation, please see attached instructions for diet.   ° °ACTIVITY: Your care partner should take you home directly after the procedure. You should plan to take it easy, moving slowly for the rest of the day. You can resume normal activity the day after the procedure however YOU SHOULD NOT DRIVE, use power tools, machinery or perform tasks that involve climbing or major physical exertion for 24 hours (because of the sedation medicines used during the test).  ° °SYMPTOMS TO REPORT IMMEDIATELY: °A gastroenterologist can be reached at any hour. Please call [phone]  for any of the following symptoms:  °Following lower endoscopy (colonoscopy, flexible sigmoidoscopy) °Excessive amounts of blood in the stool  °Significant tenderness, worsening of abdominal pains  °Swelling of the abdomen that is new, acute  °Fever of 100° or  higher  °Following upper endoscopy (EGD, EUS, ERCP, esophageal dilation) °Vomiting of blood or coffee ground material  °New, significant abdominal pain  °New, significant chest pain or pain under the shoulder blades  °Painful or persistently difficult swallowing  °New shortness of breath  °Black, tarry-looking or red, bloody stools ° °FOLLOW UP:  °If any biopsies were taken you will be contacted by phone or by letter within the next 1-3 weeks. Call [phone]  if you have not heard about the biopsies in 3 weeks.  °Please also call with any specific questions about appointments or follow up tests. ° °

## 2017-09-04 NOTE — Op Note (Signed)
[place] Patient Name: Dana Schultz Procedure Date: 09/04/2017 3:17 PM MRN: [id number] Endoscopist: Jerene Bears , MD Age: 62 Referring MD:  Date of Birth: [birth year] Gender: Female Account #: [id number] Procedure:                Colonoscopy Indications:              Surveillance: Personal history of adenomatous                            polyps on last colonoscopy 5 years ago Medicines:                Monitored Anesthesia Care Procedure:                Pre-Anesthesia Assessment:                           - Prior to the procedure, a History and Physical                            was performed, and patient medications and                            allergies were reviewed. The patient's tolerance of                            previous anesthesia was also reviewed. The risks                            and benefits of the procedure and the sedation                            options and risks were discussed with the patient.                            All questions were answered, and informed consent                            was obtained. Prior Anticoagulants: The patient has                            taken no previous anticoagulant or antiplatelet                            agents. ASA Grade Assessment: II - A patient with                            mild systemic disease. After reviewing the risks                            and benefits, the patient was deemed in                            satisfactory condition to undergo the procedure.  After obtaining informed consent, the colonoscope                            was passed under direct vision. Throughout the                            procedure, the patient's blood pressure, pulse, and                            oxygen saturations were monitored continuously. The                            Colonoscope was introduced through the anus and                            advanced to the the cecum,  identified by                            appendiceal orifice and ileocecal valve. The                            Endoscope was introduced through the and advanced                            to the. The colonoscopy was performed without                            difficulty. The patient tolerated the procedure                            well. The quality of the bowel preparation was                            good. The ileocecal valve, appendiceal orifice, and                            rectum were photographed. Scope In: 3:31:27 PM Scope Out: 3:44:33 PM Scope Withdrawal Time: 0 hours 10 minutes 20 seconds  Total Procedure Duration: 0 hours 13 minutes 6 seconds  Findings:                 The digital rectal exam was normal.                           Two sessile polyps were found in the sigmoid colon                            and transverse colon. The polyps were 3 to 5 mm in                            size. These polyps were removed with a cold snare.                            Resection and retrieval were  complete.                           A few small-mouthed diverticula were found in the                            sigmoid colon and ascending colon.                           Internal hemorrhoids were found during                            retroflexion. The hemorrhoids were small. Complications:            No immediate complications. Estimated Blood Loss:     Estimated blood loss was minimal. Impression:               - Two 3 to 5 mm polyps in the sigmoid colon and in                            the transverse colon, removed with a cold snare.                            Resected and retrieved.                           - Mild diverticulosis in the sigmoid colon and in                            the ascending colon.                           - Small internal hemorrhoids. Recommendation:           - Patient has a contact number available for                            emergencies. The  signs and symptoms of potential                            delayed complications were discussed with the                            patient. Return to normal activities tomorrow.                            Written discharge instructions were provided to the                            patient.                           - Resume previous diet.                           - Continue present medications.                           -  Await pathology results.                           - Repeat colonoscopy is recommended. The                            colonoscopy date will be determined after pathology                            results from today's exam become available for                            review. Jerene Bears, MD 09/04/2017 3:50:38 PM This report has been signed electronically.

## 2017-09-04 NOTE — Op Note (Signed)
[place] Patient Name: Dana Schultz Procedure Date: 09/04/2017 3:17 PM MRN: [id number] Endoscopist: Jerene Bears , MD Age: 62 Referring MD:  Date of Birth: [birth year] Gender: Female Account #: [id number] Procedure:                Upper GI endoscopy Indications:              Epigastric abdominal pain despite once daily PPI Medicines:                Monitored Anesthesia Care Procedure:                Pre-Anesthesia Assessment:                           - Prior to the procedure, a History and Physical                            was performed, and patient medications and                            allergies were reviewed. The patient's tolerance of                            previous anesthesia was also reviewed. The risks                            and benefits of the procedure and the sedation                            options and risks were discussed with the patient.                            All questions were answered, and informed consent                            was obtained. Prior Anticoagulants: The patient has                            taken no previous anticoagulant or antiplatelet                            agents. ASA Grade Assessment: II - A patient with                            mild systemic disease. After reviewing the risks                            and benefits, the patient was deemed in                            satisfactory condition to undergo the procedure.                           After obtaining informed consent, the endoscope was  passed under direct vision. Throughout the                            procedure, the patient's blood pressure, pulse, and                            oxygen saturations were monitored continuously. The                            Endoscope was introduced through the mouth, and                            advanced to the second part of duodenum. The upper                            GI endoscopy  was accomplished without difficulty.                            The patient tolerated the procedure well. Scope In: Scope Out: Findings:                 The examined esophagus was normal.                           The entire examined stomach was normal. Biopsies                            were taken with a cold forceps for histology and                            Helicobacter pylori testing (body, antrum and                            incisura).                           Multiple non-bleeding linear and superficial                            duodenal ulcers were found in the duodenal bulb and                            in the second portion of the duodenum. Biopsies                            were taken with a cold forceps for histology. Complications:            No immediate complications. Estimated Blood Loss:     Estimated blood loss was minimal. Impression:               - Normal esophagus.                           - Normal stomach. Biopsied.                           -  Multiple duodenal ulcers. Biopsied. Recommendation:           - Patient has a contact number available for                            emergencies. The signs and symptoms of potential                            delayed complications were discussed with the                            patient. Return to normal activities tomorrow.                            Written discharge instructions were provided to the                            patient.                           - Resume previous diet.                           - Continue present medications, but increase                            pantoprazole to 40 mg twice daily (before 1st and                            last meal of the day).                           - Await pathology results.                           - No aspirin, ibuprofen, naproxen, or other                            non-steroidal anti-inflammatory drugs. Jerene Bears, MD 09/04/2017 3:48:39 PM This  report has been signed electronically.

## 2017-09-05 ENCOUNTER — Telehealth: Payer: Self-pay | Admitting: *Deleted

## 2017-09-05 NOTE — Telephone Encounter (Signed)
  Follow up Call-  Call back number 09/04/2017  Post procedure Call Back phone  # [phone]  Permission to leave phone message Yes  Some recent data might be hidden     Patient questions:  Do you have a fever, pain , or abdominal swelling? No. Pain Score  0 *  Have you tolerated food without any problems? Yes.    Have you been able to return to your normal activities? Yes.    Do you have any questions about your discharge instructions: Diet   No. Medications  No. Follow up visit  No.  Do you have questions or concerns about your Care? No.  Actions: * If pain score is 4 or above: No action needed, pain <4.

## 2017-09-10 ENCOUNTER — Encounter: Payer: Self-pay | Admitting: Internal Medicine

## 2018-04-21 ENCOUNTER — Encounter: Payer: Self-pay | Admitting: Internal Medicine

## 2018-04-21 ENCOUNTER — Ambulatory Visit: Payer: Medicaid Other | Admitting: Internal Medicine

## 2018-04-21 ENCOUNTER — Other Ambulatory Visit (HOSPITAL_COMMUNITY)
Admission: RE | Admit: 2018-04-21 | Discharge: 2018-04-21 | Disposition: A | Discharge status: Home / Self Care | Payer: Medicaid Other | Source: Ambulatory Visit | Attending: Internal Medicine | Admitting: Internal Medicine

## 2018-04-21 VITALS — BP 129/81 | HR 69 | Temp 97.8°F | Wt 97.1 lb

## 2018-04-21 DIAGNOSIS — Z79899 Other long term (current) drug therapy: Secondary | ICD-10-CM | POA: Diagnosis not present

## 2018-04-21 DIAGNOSIS — K219 Gastro-esophageal reflux disease without esophagitis: Secondary | ICD-10-CM | POA: Diagnosis not present

## 2018-04-21 DIAGNOSIS — Z124 Encounter for screening for malignant neoplasm of cervix: Secondary | ICD-10-CM

## 2018-04-21 DIAGNOSIS — M1289 Other specific arthropathies, not elsewhere classified, multiple sites: Secondary | ICD-10-CM

## 2018-04-21 DIAGNOSIS — Z823 Family history of stroke: Secondary | ICD-10-CM | POA: Diagnosis not present

## 2018-04-21 DIAGNOSIS — M12819 Other specific arthropathies, not elsewhere classified, unspecified shoulder: Secondary | ICD-10-CM

## 2018-04-21 DIAGNOSIS — Z8249 Family history of ischemic heart disease and other diseases of the circulatory system: Secondary | ICD-10-CM

## 2018-04-21 DIAGNOSIS — E782 Mixed hyperlipidemia: Secondary | ICD-10-CM

## 2018-04-21 DIAGNOSIS — G8929 Other chronic pain: Secondary | ICD-10-CM

## 2018-04-21 DIAGNOSIS — E785 Hyperlipidemia, unspecified: Secondary | ICD-10-CM

## 2018-04-21 MED ORDER — IBUPROFEN 600 MG PO TABS: ORAL_TABLET | ORAL | 0 refills | Status: DC

## 2018-04-21 MED ORDER — ROSUVASTATIN CALCIUM 20 MG PO TABS: 20.0000 mg | ORAL_TABLET | Freq: Every day | ORAL | 3 refills | Status: DC

## 2018-04-21 MED ORDER — PANTOPRAZOLE SODIUM 40 MG PO TBEC: 40.0000 mg | DELAYED_RELEASE_TABLET | Freq: Every day | ORAL | 3 refills | Status: DC

## 2018-04-21 NOTE — Patient Instructions (Addendum)
Dana Schultz, your cholesterol has been high in the past.  We will start you on a new cholesterol medicine called rosuvastatin.  This will help reduce your risk of heart attack and stroke in the future.  We will keep going with the as needed ibuprofen for your shoulder pain.    Rosuvastatin Tablets What is this medicine? ROSUVASTATIN (roe SOO va sta tin) is known as a HMG-CoA reductase inhibitor or 'statin'. It lowers cholesterol and triglycerides in the blood. This drug may also reduce the risk of heart attack, stroke, or other health problems in patients with risk factors for heart disease. Diet and lifestyle changes are often used with this drug. This medicine may be used for other purposes; ask your health care provider or pharmacist if you have questions. COMMON BRAND NAME(S): Crestor What should I tell my health care provider before I take this medicine? They need to know if you have any of these conditions: -frequently drink alcoholic beverages -kidney disease -liver disease -muscle aches or weakness -other medical condition -an unusual or allergic reaction to rosuvastatin, other medicines, foods, dyes, or preservatives -pregnant or trying to get pregnant -breast-feeding How should I use this medicine? Take this medicine by mouth with a glass of water. Follow the directions on the prescription label. Do not cut, crush or chew this medicine. You can take this medicine with or without food. Take your doses at regular intervals. Do not take your medicine more often than directed. Talk to your pediatrician regarding the use of this medicine in children. While this drug may be prescribed for children as young as 82 years old for selected conditions, precautions do apply. Overdosage: If you think you have taken too much of this medicine contact a poison control center or emergency room at once. NOTE: This medicine is only for you. Do not share this medicine with others. What if I miss a dose? If  you miss a dose, take it as soon as you can. Do not take 2 doses within 12 hours of each other. If there are less than 12 hours until your next dose, take only that dose. Do not take double or extra doses. What may interact with this medicine? Do not take this medicine with any of the following medications: -herbal medicines like red yeast rice This medicine may also interact with the following medications: -alcohol -antacids containing aluminum hydroxide or magnesium hydroxide -cyclosporine -other medicines for high cholesterol -some medicines for HIV infection -warfarin This list may not describe all possible interactions. Give your health care provider a list of all the medicines, herbs, non-prescription drugs, or dietary supplements you use. Also tell them if you smoke, drink alcohol, or use illegal drugs. Some items may interact with your medicine. What should I watch for while using this medicine? Visit your doctor or health care professional for regular check-ups. You may need regular tests to make sure your liver is working properly. Tell your doctor or health care professional right away if you get any unexplained muscle pain, tenderness, or weakness, especially if you also have a fever and tiredness. Your doctor or health care professional may tell you to stop taking this medicine if you develop muscle problems. If your muscle problems do not go away after stopping this medicine, contact your health care professional. This medicine may affect blood sugar levels. If you have diabetes, check with your doctor or health care professional before you change your diet or the dose of your diabetic medicine. Avoid taking  antacids containing aluminum, calcium or magnesium within 2 hours of taking this medicine. This drug is only part of a total heart-health program. Your doctor or a dietician can suggest a low-cholesterol and low-fat diet to help. Avoid alcohol and smoking, and keep a proper  exercise schedule. Do not use this drug if you are pregnant or breast-feeding. Serious side effects to an unborn child or to an infant are possible. Talk to your doctor or pharmacist for more information. What side effects may I notice from receiving this medicine? Side effects that you should report to your doctor or health care professional as soon as possible: -allergic reactions like skin rash, itching or hives, swelling of the face, lips, or tongue -dark urine -fever -joint pain -muscle cramps, pain -redness, blistering, peeling or loosening of the skin, including inside the mouth -trouble passing urine or change in the amount of urine -unusually weak or tired -yellowing of the eyes or skin Side effects that usually do not require medical attention (report to your doctor or health care professional if they continue or are bothersome): -constipation -heartburn -nausea -stomach gas, pain, upset This list may not describe all possible side effects. Call your doctor for medical advice about side effects. You may report side effects to FDA at 1-800-FDA-1088. Where should I keep my medicine? Keep out of the reach of children. Store at room temperature between 20 and 25 degrees C (68 and 77 degrees F). Keep container tightly closed (protect from moisture). Throw away any unused medicine after the expiration date. NOTE: This sheet is a summary. It may not cover all possible information. If you have questions about this medicine, talk to your doctor, pharmacist, or health care provider.  2018 Elsevier/Gold Standard (2014-11-17 13:33:08)

## 2018-04-21 NOTE — Progress Notes (Signed)
CC: Bilateral shoulder pain, HLD,  HPI:  [name] is a 62 y.o. female with PMH below.  Today we will address Bilateral shoulder pain, HLD,   Please see A&P for status of the patient's chronic medical conditions  Past Medical History:  Diagnosis Date  . Cataract 2019   bilateral  . Cervical spondylosis with radiculopathy 05/01/2012  . Cystitis 05/01/2012  . Diverticulosis   . Mild acid reflux 05/01/2012  . Rotator cuff arthropathy 05/01/2012  . Tubular adenoma    Review of Systems:  [place]: Pulmonary: pt denies increased work of breathing, shortness of breath,  Cardiac: pt denies palpitations, chest pain,  Abdominal: pt denies abdominal pain, nausea, vomiting, or diarrhea  Physical Exam:  Vitals:   04/21/18 1014  BP: 129/81  Pulse: 69  Temp: 97.8 F (36.6 C)  TempSrc: Oral  SpO2: 97%  Weight: 97 lb 1.6 oz (44 kg)   Cardiac: normal rate and rhythm, clear s1 and s2 Pulmonary: CTAB, not in distress Abdominal: non distended abdomen, soft and nontender Extremities: no LE edema Musculoskeletal: Patient is empty can positive bilaterally much worse on the right than left.  She has normal range of motion of bilateral shoulder joints.  neers positive R>L hawkins negative. Psych: Alert, conversant, in good spirits   Social History   Socioeconomic History  . Marital status: Single    Spouse name: Not on file  . Number of children: 0  . Years of education: Not on file  . Highest education level: Not on file  Occupational History  . Not on file  Social Needs  . Financial resource strain: Not on file  . Food insecurity:    Worry: Not on file    Inability: Not on file  . Transportation needs:    Medical: Not on file    Non-medical: Not on file  Tobacco Use  . Smoking status: Never Smoker  . Smokeless tobacco: Never Used  Substance and Sexual Activity  . Alcohol use: No    Alcohol/week: 0.0 standard drinks  . Drug use: No  . Sexual activity: Not Currently   Partners: Male  Lifestyle  . Physical activity:    Days per week: Not on file    Minutes per session: Not on file  . Stress: Not on file  Relationships  . Social connections:    Talks on phone: Not on file    Gets together: Not on file    Attends religious service: Not on file    Active member of club or organization: Not on file    Attends meetings of clubs or organizations: Not on file    Relationship status: Not on file  . Intimate partner violence:    Fear of current or ex partner: Not on file    Emotionally abused: Not on file    Physically abused: Not on file    Forced sexual activity: Not on file  Other Topics Concern  . Not on file  Social History Narrative   Immigrated to [place] in [place] from [place].  Lived in [place] until about 4 years ago when she moved to [place].  Lives with her niece.    Family History  Problem Relation Age of Onset  . Cancer Neg Hx   . Heart disease Neg Hx   . Stroke Neg Hx   . Colon cancer Neg Hx   . Stomach cancer Neg Hx     Assessment & Plan:   See Encounters Tab  for problem based charting.  Patient discussed with Dr. Lynnae January

## 2018-04-22 DIAGNOSIS — Z124 Encounter for screening for malignant neoplasm of cervix: Secondary | ICD-10-CM | POA: Insufficient documentation

## 2018-04-22 NOTE — Progress Notes (Signed)
Internal Medicine Clinic Attending  Case discussed with Dr. Winfrey  at the time of the visit.  We reviewed the resident's history and exam and pertinent patient test results.  I agree with the assessment, diagnosis, and plan of care documented in the resident's note.  

## 2018-04-22 NOTE — Assessment & Plan Note (Signed)
Lab Results  Component Value Date   CHOL 261 (H) 10/31/2016   HDL 51 10/31/2016   LDLCALC 172 (H) 10/31/2016   TRIG 189 (H) 10/31/2016   CHOLHDL 5.1 (H) 10/31/2016   Patient's last lipid panel severely elevated.   Patient has strong family history of MI and stroke.    -Will start patient on crestor 20mg  daily and monitor for response.  With the help of the interpreter we discussed small risk of myalgias and she will let me know if any occur.

## 2018-04-22 NOTE — Assessment & Plan Note (Signed)
Pt requires refills on medications with associated diagnosis above.  Reviewed disease process and find this medication to be necessary, will not change dose or alter current therapy. 

## 2018-04-22 NOTE — Assessment & Plan Note (Signed)
Screened patient for cervical cancer today with pap smear she was long past due.  If negative this will be her last screening test as she is low risk.

## 2018-04-22 NOTE — Assessment & Plan Note (Signed)
Shoulder pain mainly when laying on shoulder's at night.  Takes advil occasionally before bedtime and it helps quite a bit.  This is a chronic problem for the patient has been going on for many years.  Old films show joint space narrowing especially AC joint.  Has benefited from injections in the past by our sports medicine colleagues but is not interested in that today.  Her exam is reassuring for no rotator cuff or ligamental tears.  She is using advil about 2-3 times per week before bed.    -encouraged pt to take advil with her dinner meal if possible before bed, I am okay with continuing this as she is not using it daily or multiple times per day and will refill today.

## 2018-04-27 ENCOUNTER — Encounter: Payer: Self-pay | Admitting: Internal Medicine

## 2018-04-28 LAB — CYTOLOGY - PAP
Diagnosis: NEGATIVE
HPV (WINDOPATH): NOT DETECTED

## 2018-06-26 ENCOUNTER — Other Ambulatory Visit: Payer: Self-pay | Admitting: Internal Medicine

## 2018-06-26 DIAGNOSIS — Z1231 Encounter for screening mammogram for malignant neoplasm of breast: Secondary | ICD-10-CM

## 2018-07-02 ENCOUNTER — Other Ambulatory Visit: Payer: Self-pay

## 2018-07-02 ENCOUNTER — Ambulatory Visit: Payer: Medicaid Other | Admitting: Internal Medicine

## 2018-07-02 ENCOUNTER — Encounter: Payer: Self-pay | Admitting: Internal Medicine

## 2018-07-02 VITALS — BP 124/73 | HR 73 | Temp 98.0°F | Ht <= 58 in | Wt 98.8 lb

## 2018-07-02 DIAGNOSIS — Z79899 Other long term (current) drug therapy: Secondary | ICD-10-CM | POA: Diagnosis not present

## 2018-07-02 DIAGNOSIS — M4722 Other spondylosis with radiculopathy, cervical region: Secondary | ICD-10-CM

## 2018-07-02 DIAGNOSIS — E785 Hyperlipidemia, unspecified: Secondary | ICD-10-CM | POA: Diagnosis not present

## 2018-07-02 DIAGNOSIS — M19011 Primary osteoarthritis, right shoulder: Secondary | ICD-10-CM

## 2018-07-02 DIAGNOSIS — K219 Gastro-esophageal reflux disease without esophagitis: Secondary | ICD-10-CM | POA: Diagnosis not present

## 2018-07-02 DIAGNOSIS — M12811 Other specific arthropathies, not elsewhere classified, right shoulder: Secondary | ICD-10-CM

## 2018-07-02 DIAGNOSIS — K5790 Diverticulosis of intestine, part unspecified, without perforation or abscess without bleeding: Secondary | ICD-10-CM | POA: Diagnosis not present

## 2018-07-02 DIAGNOSIS — M12812 Other specific arthropathies, not elsewhere classified, left shoulder: Secondary | ICD-10-CM

## 2018-07-02 DIAGNOSIS — E782 Mixed hyperlipidemia: Secondary | ICD-10-CM

## 2018-07-02 DIAGNOSIS — G8929 Other chronic pain: Secondary | ICD-10-CM

## 2018-07-02 MED ORDER — GABAPENTIN 300 MG PO CAPS: 300.0000 mg | ORAL_CAPSULE | Freq: Every day | ORAL | 0 refills | Status: DC

## 2018-07-02 MED ORDER — ROSUVASTATIN CALCIUM 20 MG PO TABS: 20.0000 mg | ORAL_TABLET | Freq: Every day | ORAL | 3 refills | Status: DC

## 2018-07-02 NOTE — Progress Notes (Signed)
CC: Shoulder pain  HPI: Dana Schultz is a 63 y.o.F w/ PMH of cervical spondylosis, rotator cuff arthropathy, HLD and diverticulosis presenting with complaints of R shoulder pain. She was evaluated with the aid of a video interpreter Hoyle Sauer [id number]) She states she has been experiencing 5/10 shoulder pain radiating from neck up to her forearm bilaterally but significantly more severe on right side with concomitant numbness and tingling. She states this is a chronic issue previously managed by Sports Med with injection but she wants to stick with medications and does not any further injections or surgical intervention. She mentions that Dr.Winfrey prescribed her ibuprofen for the pain and her pain appears improved during daytime but her night time pain is still present and is severe enough to interfere with sleep. She also tried some topical anti-inflammatory patch purchased at the [place] store with significant relief. She denies any F/N/V/D. Denies any significant worsening of her pain since last visit.  Past Medical History:  Diagnosis Date  . Cataract 2019   bilateral  . Cervical spondylosis with radiculopathy 05/01/2012  . Cystitis 05/01/2012  . Diverticulosis   . Mild acid reflux 05/01/2012  . Rotator cuff arthropathy 05/01/2012  . Tubular adenoma    Review of Systems: Review of Systems  Constitutional: Negative for chills, fever and malaise/fatigue.  Respiratory: Negative for shortness of breath.   Cardiovascular: Negative for chest pain and palpitations.  Gastrointestinal: Negative for diarrhea, nausea and vomiting.  Musculoskeletal: Positive for joint pain and neck pain. Negative for back pain and myalgias.     Physical Exam: Vitals:   07/02/18 1449  BP: 124/73  Pulse: 73  Temp: 98 F (36.7 C)  TempSrc: Oral  SpO2: 98%  Weight: 98 lb 12.8 oz (44.8 kg)  Height: 4\' 7"  (1.397 m)   Physical Exam  Constitutional: She is oriented to person, place, and time. She appears  well-developed and well-nourished. No distress.  Cardiovascular: Normal rate, regular rhythm, normal heart sounds and intact distal pulses.  No murmur heard. Respiratory: Effort normal and breath sounds normal. She has no wheezes. She has no rales.  GI: Soft. Bowel sounds are normal. She exhibits no distension. There is no abdominal tenderness.  Musculoskeletal:        General: Tenderness (Right and left shoulder with tenderness on active ROM but passive ROM intact. + empty can test bilaterally. + Hawkins test on R side. negative Neer test bilaterally) present.  Neurological: She is alert and oriented to person, place, and time. No cranial nerve deficit.  Numbness to fine touch sensation in C5 distribution  Skin: Skin is warm and dry.      Assessment & Plan:   GERD (gastroesophageal reflux disease) Requesting refill on her pantoprazole. Chart review shows year-supply was ordered on 04/2018. Reassured her that her pantoprazole order has multiple refills and she should be able to pick up the medication.  Hyperlipidemia She mentions that she had stopped taking her Crestor as she believed she needed new refills from the doctors. Chart review shows she was started on rosuvastatin 20mg  daily. She denies any significant myalgias while she was on therapy.  - Re-ordered rosuvastatin 20mg  daily - Will defer repeating lipid panel until she is more consistently on therapy  Rotator cuff arthropathy Presenting with complaints of chronic shoulder pain R>L. She has known cervical spondylosis but also has evidence of rotator cuff injury. Pain is improving with OTC topical patch as well as ibuprofen but patient is requesting alternative therapies. Physical exam  show positive signs of impingement but also shows numbness concerning for radiculopathy. Her chronic shoulder pain is most likely multi-factorial with mix of right shoulder arthritis and radiculopathy due to known cervical spondylosis. Her  musculoskeletal pain appears to be well treated with ibuprofen and topical analgesics. We will try to treat her neuropathic pain with gabapentin.  - Gabapentin 300mg  qhs with recommendation to increase dose if well-tolerated   Cervical spondylosis with radiculopathy History of cervical spondylosis treated with steroid injections by sports medicine couple years ago. Patient would like avoid any further injections and try to manage with medication alone. Refer to 'rotator cuff arthropathy' assessment and plan for more details    Patient discussed with Dr. Eppie Gibson   -Gilberto Better, PGY1

## 2018-07-02 NOTE — Patient Instructions (Addendum)
Dana Schultz,  Thank you for allowing [place] to provide your care today. Today we discussed your shoulder pain  I have ordered lipid panel labs for you. I will call if any are abnormal.    Today we made the following changes to your medications:  - Start gabapentin 300mg  at nighttime   Please follow-up in with your PCP.    Should you have any questions or concerns please call the internal medicine clinic at [phone].

## 2018-07-03 ENCOUNTER — Encounter: Payer: Self-pay | Admitting: Internal Medicine

## 2018-07-03 NOTE — Assessment & Plan Note (Signed)
Requesting refill on her pantoprazole. Chart review shows year-supply was ordered on 04/2018. Reassured her that her pantoprazole order has multiple refills and she should be able to pick up the medication.

## 2018-07-03 NOTE — Assessment & Plan Note (Signed)
Presenting with complaints of chronic shoulder pain R>L. She has known cervical spondylosis but also has evidence of rotator cuff injury. Pain is improving with OTC topical patch as well as ibuprofen but patient is requesting alternative therapies. Physical exam show positive signs of impingement but also shows numbness concerning for radiculopathy. Her chronic shoulder pain is most likely multi-factorial with mix of right shoulder arthritis and radiculopathy due to known cervical spondylosis. Her musculoskeletal pain appears to be well treated with ibuprofen and topical analgesics. We will try to treat her neuropathic pain with gabapentin.  - Gabapentin 300mg  qhs with recommendation to increase dose if well-tolerated

## 2018-07-03 NOTE — Progress Notes (Signed)
Case discussed with Dr. Truman Hayward at the time of the visit.  We reviewed the resident's history and exam and pertinent patient test results.  I agree with the assessment, diagnosis and plan of care documented in the resident's note.

## 2018-07-03 NOTE — Assessment & Plan Note (Signed)
She mentions that she had stopped taking her Crestor as she believed she needed new refills from the doctors. Chart review shows she was started on rosuvastatin 20mg  daily. She denies any significant myalgias while she was on therapy.  - Re-ordered rosuvastatin 20mg  daily - Will defer repeating lipid panel until she is more consistently on therapy

## 2018-07-03 NOTE — Assessment & Plan Note (Signed)
History of cervical spondylosis treated with steroid injections by sports medicine couple years ago. Patient would like avoid any further injections and try to manage with medication alone. Refer to 'rotator cuff arthropathy' assessment and plan for more details

## 2018-08-10 ENCOUNTER — Ambulatory Visit
Admission: RE | Admit: 2018-08-10 | Discharge: 2018-08-10 | Disposition: A | Discharge status: Home / Self Care | Payer: Medicaid Other | Source: Ambulatory Visit | Attending: Internal Medicine | Admitting: Internal Medicine

## 2018-08-10 DIAGNOSIS — Z1231 Encounter for screening mammogram for malignant neoplasm of breast: Secondary | ICD-10-CM

## 2019-07-30 ENCOUNTER — Other Ambulatory Visit: Payer: Self-pay | Admitting: Internal Medicine

## 2019-07-30 DIAGNOSIS — Z1231 Encounter for screening mammogram for malignant neoplasm of breast: Secondary | ICD-10-CM

## 2019-08-07 ENCOUNTER — Ambulatory Visit: Payer: Medicaid Other | Attending: Internal Medicine

## 2019-08-07 DIAGNOSIS — Z23 Encounter for immunization: Secondary | ICD-10-CM

## 2019-08-07 NOTE — Progress Notes (Signed)
   Covid-19 Vaccination Clinic  Name:  LASHYIA LAGNESE    MRN: [id number] DOB: [birth year]  08/07/2019  Ms. Rasco was observed post Covid-19 immunization for 15 minutes without incidence. She was provided with Vaccine Information Sheet and instruction to access the V-Safe system.   Ms. Dungee was instructed to call 911 with any severe reactions post vaccine: Marland Kitchen Difficulty breathing  . Swelling of your face and throat  . A fast heartbeat  . A bad rash all over your body  . Dizziness and weakness    Immunizations Administered    Name Date Dose VIS Date Route   Pfizer COVID-19 Vaccine 08/07/2019  3:48 PM 0.3 mL 05/28/2019 Intramuscular   Manufacturer: [place]   Lot: [id number]   [place]: [id number]

## 2019-08-30 ENCOUNTER — Ambulatory Visit: Payer: Medicaid Other | Attending: Internal Medicine

## 2019-08-30 DIAGNOSIS — Z23 Encounter for immunization: Secondary | ICD-10-CM

## 2019-08-30 NOTE — Progress Notes (Signed)
   Covid-19 Vaccination Clinic  Name:  [name]    MRN: [id number] DOB: 02-23-1956  08/30/2019  Dana Schultz was observed post Covid-19 immunization for 15 minutes without incident. She was provided with Vaccine Information Sheet and instruction to access the V-Safe system.   Dana Schultz was instructed to call 911 with any severe reactions post vaccine: Marland Kitchen Difficulty breathing  . Swelling of face and throat  . A fast heartbeat  . A bad rash all over body  . Dizziness and weakness   Immunizations Administered    Name Date Dose VIS Date Route   Pfizer COVID-19 Vaccine 08/30/2019 11:08 AM 0.3 mL 05/28/2019 Intramuscular   Manufacturer: [place]   Lot: [id number]   [place]: [id number]

## 2019-09-07 ENCOUNTER — Ambulatory Visit
Admission: RE | Admit: 2019-09-07 | Discharge: 2019-09-07 | Disposition: A | Discharge status: Home / Self Care | Payer: Medicaid Other | Source: Ambulatory Visit | Attending: Internal Medicine | Admitting: Internal Medicine

## 2019-09-07 ENCOUNTER — Other Ambulatory Visit: Payer: Self-pay

## 2019-09-07 DIAGNOSIS — Z1231 Encounter for screening mammogram for malignant neoplasm of breast: Secondary | ICD-10-CM

## 2019-10-08 ENCOUNTER — Encounter: Payer: Self-pay | Admitting: *Deleted

## 2020-07-28 ENCOUNTER — Other Ambulatory Visit: Payer: Self-pay | Admitting: Internal Medicine

## 2020-07-28 DIAGNOSIS — Z1231 Encounter for screening mammogram for malignant neoplasm of breast: Secondary | ICD-10-CM

## 2020-09-15 ENCOUNTER — Ambulatory Visit: Payer: Medicaid Other

## 2020-10-24 ENCOUNTER — Other Ambulatory Visit: Payer: Self-pay

## 2020-10-24 ENCOUNTER — Ambulatory Visit
Admission: RE | Admit: 2020-10-24 | Discharge: 2020-10-24 | Disposition: A | Discharge status: Home / Self Care | Payer: Medicaid Other | Source: Ambulatory Visit | Attending: Internal Medicine | Admitting: Internal Medicine

## 2020-10-24 ENCOUNTER — Ambulatory Visit: Payer: Medicaid Other

## 2020-10-24 DIAGNOSIS — Z1231 Encounter for screening mammogram for malignant neoplasm of breast: Secondary | ICD-10-CM

## 2020-11-19 ENCOUNTER — Encounter: Payer: Self-pay | Admitting: *Deleted

## 2021-03-27 ENCOUNTER — Other Ambulatory Visit: Payer: Self-pay

## 2021-03-27 ENCOUNTER — Encounter: Payer: Self-pay | Admitting: Student

## 2021-03-27 ENCOUNTER — Ambulatory Visit: Payer: Medicaid Other | Admitting: Internal Medicine

## 2021-03-27 VITALS — BP 135/75 | HR 78 | Temp 98.0°F | Ht <= 58 in | Wt 91.0 lb

## 2021-03-27 DIAGNOSIS — M4722 Other spondylosis with radiculopathy, cervical region: Secondary | ICD-10-CM

## 2021-03-27 DIAGNOSIS — R42 Dizziness and giddiness: Secondary | ICD-10-CM

## 2021-03-27 DIAGNOSIS — Z23 Encounter for immunization: Secondary | ICD-10-CM | POA: Diagnosis present

## 2021-03-27 DIAGNOSIS — Z0000 Encounter for general adult medical examination without abnormal findings: Secondary | ICD-10-CM

## 2021-03-27 DIAGNOSIS — Z9189 Other specified personal risk factors, not elsewhere classified: Secondary | ICD-10-CM | POA: Diagnosis not present

## 2021-03-27 DIAGNOSIS — E782 Mixed hyperlipidemia: Secondary | ICD-10-CM

## 2021-03-27 DIAGNOSIS — F5101 Primary insomnia: Secondary | ICD-10-CM

## 2021-03-27 DIAGNOSIS — M12811 Other specific arthropathies, not elsewhere classified, right shoulder: Secondary | ICD-10-CM | POA: Diagnosis not present

## 2021-03-27 DIAGNOSIS — M12812 Other specific arthropathies, not elsewhere classified, left shoulder: Secondary | ICD-10-CM | POA: Diagnosis not present

## 2021-03-27 MED ORDER — GABAPENTIN 300 MG PO CAPS: 300.0000 mg | ORAL_CAPSULE | Freq: Every day | ORAL | 0 refills | Status: DC

## 2021-03-27 NOTE — Patient Instructions (Addendum)
???????? ?? ???  ????????????????????????????????????????????? ??????????????????????  ??????????: ?????????????????????????????????????????????? ?????????????????????????????????????????????? ???????????? ???????????????   ENSURE ??????????????????  ??????? ??????????????????????????????????? - GABAPENTIN 300mg  ????????  ???????????????????????????????????????????????????????? ???????????????????????????????????????????  ????????????????????? ???????????? ????????????????????????????????? [phone] ?????????? 9 ????? ??? 5 ????? ??????????????????????? ????????????? [phone] ????????????????????????????????????????????? ???????????????????????????????????????????????????????????? ???????????????? 911?  ???????? ?????????????????????????????????????!   Dana Schultz,  It was a pleasure seeing you in clinic. Today we discussed:   Weight loss:  I am checking some labs today. I will call you with any updates. Meanwhile, please start drinking ENSURE two times a day.   Shoulder pain:  I have sent a refill of your medication to the pharmacy - GABAPENTIN 300mg  AT NIGHT  I have also ordered a scan to check on your bone health. Please schedule this at your earliest convenience.  If you have any questions or concerns, please call our clinic at [phone] between 9am-5pm and after hours call [phone] and ask for the internal medicine resident on call. If you feel you are having a medical emergency please call 911.   Thank you, we look forward to helping you remain healthy!

## 2021-03-27 NOTE — Progress Notes (Signed)
   CC: lightheadedness/insomnia  HPI:  [name] is a 65 y.o. female with PMHx as stated below presenting for evaluation of intermittent positional lightheadedness. Denies dizziness/room spinning sensation. She notes that this improves with a neck massage. She attributes this to decreased sleep over the past 2-3 days and believes that this may be contributing to her symptoms. She denies any headaches, vision changes or weakness.   Past Medical History:  Diagnosis Date   Cataract 2019   bilateral   Cervical spondylosis with radiculopathy 05/01/2012   Cystitis 05/01/2012   Diverticulosis    Mild acid reflux 05/01/2012   Rotator cuff arthropathy 05/01/2012   Tubular adenoma    Review of Systems:  Negative except as stated in HPI.   Physical Exam:  Vitals:   03/27/21 1547  BP: 135/75  Pulse: 78  Temp: 98 F (36.7 C)  TempSrc: Oral  SpO2: 100%  Weight: 91 lb (41.3 kg)  Height: 4\' 7"  (1.397 m)   Physical Exam  Constitutional: Elderly thin female, no acute distress HENT: Normocephalic and atraumatic, moist mucous membranes Cardiovascular: Normal rate, regular rhythm, S1 and S2 present, no murmurs, rubs, gallops.  Distal pulses intact Respiratory: No respiratory distress, Lungs are clear to auscultation bilaterally. Musculoskeletal: Normal bulk and tone.  No peripheral edema noted. Neurological: Is alert and oriented x4, no apparent focal deficits noted. Skin: Warm and dry.  No rash, erythema, lesions noted.  Assessment & Plan:   See Encounters Tab for problem based charting.  Patient discussed with Dr. Daryll Drown

## 2021-03-28 DIAGNOSIS — G4700 Insomnia, unspecified: Secondary | ICD-10-CM | POA: Insufficient documentation

## 2021-03-28 LAB — CMP14 + ANION GAP
ALT: 24 IU/L (ref 0–32)
AST: 35 IU/L (ref 0–40)
Albumin/Globulin Ratio: 1.5 (ref 1.2–2.2)
Albumin: 4.6 g/dL (ref 3.8–4.8)
Alkaline Phosphatase: 68 IU/L (ref 44–121)
Anion Gap: 13 mmol/L (ref 10.0–18.0)
BUN/Creatinine Ratio: 17 (ref 12–28)
BUN: 16 mg/dL (ref 8–27)
Bilirubin Total: 0.2 mg/dL (ref 0.0–1.2)
CO2: 28 mmol/L (ref 20–29)
Calcium: 9.8 mg/dL (ref 8.7–10.3)
Chloride: 99 mmol/L (ref 96–106)
Creatinine, Ser: 0.95 mg/dL (ref 0.57–1.00)
Globulin, Total: 3.1 g/dL (ref 1.5–4.5)
Glucose: 94 mg/dL (ref 70–99)
Potassium: 4.6 mmol/L (ref 3.5–5.2)
Sodium: 140 mmol/L (ref 134–144)
Total Protein: 7.7 g/dL (ref 6.0–8.5)
eGFR: 66 mL/min/{1.73_m2} (ref >59)

## 2021-03-28 LAB — CBC
Hematocrit: 37.5 % (ref 34.0–46.6)
Hemoglobin: 12.6 g/dL (ref 11.1–15.9)
MCH: 28.3 pg (ref 26.6–33.0)
MCHC: 33.6 g/dL (ref 31.5–35.7)
MCV: 84 fL (ref 79–97)
Platelets: 216 10*3/uL (ref 150–450)
RBC: 4.45 x10E6/uL (ref 3.77–5.28)
RDW: 12.2 % (ref 11.7–15.4)
WBC: 5.5 10*3/uL (ref 3.4–10.8)

## 2021-03-28 LAB — LIPID PANEL
Chol/HDL Ratio: 4 ratio (ref 0.0–4.4)
Cholesterol, Total: 280 mg/dL — ABNORMAL HIGH (ref 100–199)
HDL: 70 mg/dL (ref >39)
LDL Chol Calc (NIH): 190 mg/dL — ABNORMAL HIGH (ref 0–99)
Triglycerides: 114 mg/dL (ref 0–149)
VLDL Cholesterol Cal: 20 mg/dL (ref 5–40)

## 2021-03-28 LAB — TSH: TSH: 2.13 u[IU]/mL (ref 0.450–4.500)

## 2021-03-28 LAB — VITAMIN D 25 HYDROXY (VIT D DEFICIENCY, FRACTURES): Vit D, 25-Hydroxy: 34.2 ng/mL (ref 30.0–100.0)

## 2021-03-28 MED ORDER — ROSUVASTATIN CALCIUM 20 MG PO TABS: 20.0000 mg | ORAL_TABLET | Freq: Every day | ORAL | 3 refills | Status: DC

## 2021-03-28 NOTE — Assessment & Plan Note (Signed)
Flu vaccine administered at this visit DEXA scan ordered

## 2021-03-28 NOTE — Assessment & Plan Note (Signed)
Patient reports 3-4 days of insomnia in which she wakes up at night to urinate and is unable to go back to sleep for several hours. Discussed sleep hygiene.   Plan: Continue to monitor If persistent, will have patient trial of ramelteon

## 2021-03-28 NOTE — Assessment & Plan Note (Addendum)
Patient reports intermittent lightheadedness that is episodic in nature and lasts only for a few seconds.  He denies any headache, vision changes, dizziness, chest pain or palpitations or any weakness.  She also reported some insomnia the past 3-4 nights and believes that this may be contributing to this.  On chart review, patient has history of episodic lightheadedness and orthostatic lightheadedness that was thought to be autonomic dysfunction. CBC, CMP, vitamin D and TSH at this visit wnl.  Will continue to monitor at this time and encourage adequate hydration and sleep hygiene.   Plan: Continue to monitor

## 2021-03-28 NOTE — Assessment & Plan Note (Signed)
Patient with chronic bilateral shoulder pain (R>L) in setting of cervical spondylosis and prior rotator cuff injury. She was previously prescribed gabapentin for neuropathic pain. She notes that she ran out and would like a refill on this.   Plan: Gabapentin 300mg  qHS refilled, uptitrate as needed

## 2021-03-28 NOTE — Assessment & Plan Note (Signed)
Patient has been intermittently taking her Crestor. She reports not taking it since the start of the pandemic. She notes that she ran out and believed she needed to visit the doctor to get refills.  Lipid Panel     Component Value Date/Time   CHOL 280 (H) 03/27/2021 1636   TRIG 114 03/27/2021 1636   HDL 70 03/27/2021 1636   CHOLHDL 4.0 03/27/2021 1636   CHOLHDL 5.2 07/15/2013 1419   VLDL 60 (H) 07/15/2013 1419   LDLCALC 190 (H) 03/27/2021 1636   LABVLDL 20 03/27/2021 1636   Plan: Crestor 20mg  daily refilled Repeat lipid panel once patient is consistent with therapy for goal LDL <100

## 2021-03-30 NOTE — Progress Notes (Signed)
Internal Medicine Clinic Attending ° °Case discussed with Dr. Aslam  At the time of the visit.  We reviewed the resident’s history and exam and pertinent patient test results.  I agree with the assessment, diagnosis, and plan of care documented in the resident’s note.  °

## 2021-05-03 NOTE — Addendum Note (Signed)
Addended by: Harvie Heck on: 05/03/2021 03:45 PM   Modules accepted: Orders

## 2021-09-25 ENCOUNTER — Other Ambulatory Visit: Payer: Self-pay | Admitting: Internal Medicine

## 2021-09-25 DIAGNOSIS — Z13820 Encounter for screening for osteoporosis: Secondary | ICD-10-CM

## 2021-09-27 ENCOUNTER — Other Ambulatory Visit: Payer: Self-pay | Admitting: Internal Medicine

## 2021-09-27 DIAGNOSIS — Z1231 Encounter for screening mammogram for malignant neoplasm of breast: Secondary | ICD-10-CM

## 2021-10-02 ENCOUNTER — Ambulatory Visit
Admission: RE | Admit: 2021-10-02 | Discharge: 2021-10-02 | Disposition: A | Discharge status: Home / Self Care | Payer: Medicaid Other | Source: Ambulatory Visit | Attending: Internal Medicine | Admitting: Internal Medicine

## 2021-10-29 ENCOUNTER — Ambulatory Visit
Admission: RE | Admit: 2021-10-29 | Discharge: 2021-10-29 | Disposition: A | Discharge status: Home / Self Care | Payer: Medicaid Other | Source: Ambulatory Visit | Attending: Internal Medicine | Admitting: Internal Medicine

## 2021-10-29 DIAGNOSIS — Z1231 Encounter for screening mammogram for malignant neoplasm of breast: Secondary | ICD-10-CM

## 2022-08-14 ENCOUNTER — Encounter: Payer: Self-pay | Admitting: Student

## 2022-08-14 ENCOUNTER — Ambulatory Visit: Payer: Medicaid Other | Admitting: Student

## 2022-08-14 VITALS — BP 131/74 | HR 71 | Temp 97.8°F | Ht 59.0 in | Wt 90.5 lb

## 2022-08-14 DIAGNOSIS — Z23 Encounter for immunization: Secondary | ICD-10-CM

## 2022-08-14 DIAGNOSIS — M12811 Other specific arthropathies, not elsewhere classified, right shoulder: Secondary | ICD-10-CM

## 2022-08-14 DIAGNOSIS — M12812 Other specific arthropathies, not elsewhere classified, left shoulder: Secondary | ICD-10-CM

## 2022-08-14 DIAGNOSIS — F5101 Primary insomnia: Secondary | ICD-10-CM | POA: Diagnosis not present

## 2022-08-14 DIAGNOSIS — E782 Mixed hyperlipidemia: Secondary | ICD-10-CM | POA: Diagnosis present

## 2022-08-14 DIAGNOSIS — Z0000 Encounter for general adult medical examination without abnormal findings: Secondary | ICD-10-CM

## 2022-08-14 DIAGNOSIS — Z131 Encounter for screening for diabetes mellitus: Secondary | ICD-10-CM

## 2022-08-14 MED ORDER — ACETAMINOPHEN 500 MG PO TABS: 1000.0000 mg | ORAL_TABLET | Freq: Four times a day (QID) | ORAL | 2 refills | Status: DC

## 2022-08-14 MED ORDER — DICLOFENAC SODIUM 1 % EX GEL: 2.0000 g | Freq: Four times a day (QID) | CUTANEOUS | 1 refills | Status: DC

## 2022-08-14 MED ORDER — MELATONIN 3 MG PO TABS: 3.0000 mg | ORAL_TABLET | Freq: Every day | ORAL | 0 refills | Status: DC

## 2022-08-14 NOTE — Assessment & Plan Note (Addendum)
Patient reports once she ran out of her medication she did not know to pick this up further. She was previously on rosuvastatin '20mg'$  with last LDL 190. We will plan to repeat lipid panel today.  - Follow-up lipid panel  ADDENDUM: LDL 198, I have refilled high-intensity statin.

## 2022-08-14 NOTE — Progress Notes (Signed)
CC: shoulder pain, insomnia  HPI:  Dana Schultz is a 67 y.o. person with medical history as below presenting to [facility] for shoulder pain and insomnia.  Please see problem-based list for further details, assessments, and plans.  Past Medical History:  Diagnosis Date   Cataract 2019   bilateral   Cervical spondylosis with radiculopathy 05/01/2012   Cystitis 05/01/2012   Diverticulosis    Mild acid reflux 05/01/2012   Rotator cuff arthropathy 05/01/2012   Tubular adenoma    Review of Systems:  As per HPI  Physical Exam:  Vitals:   08/14/22 1028  BP: 131/74  Pulse: 71  Temp: 97.8 F (36.6 C)  TempSrc: Oral  SpO2: 97%  Weight: 90 lb 8 oz (41.1 kg)  Height: '4\' 11"'$  (1.499 m)   General: Resting comfortably in no acute distress HENT: Normocephalic, atraumatic. No cervical lymphadenopathy present.  CV: Regular rate, rhythm. No murmurs appreciated. Radial pulses 2+ bilaterally, equal.  Pulm: Normal work of breathing on room air. Clear to auscultation bilaterally.  MSK: Normal bulk, tone. Tenderness to palpation in subacromial space on R shoulder. No bony tenderness or tenderness at biceps insertion point. Hawkins and Jobe tests positive on R. Decreased active range of motion on R arm. L shoulder without tenderness, normal range of motion. No peripheral edema noted.  Skin: Warm, dry. No rashes or lesions noted.  Neuro: Awake, alert, conversing appropriately. Grossly non-focal. Psych: Normal mood, affect, speech.   Assessment & Plan:   Rotator cuff arthropathy Patient reports continued right shoulder pain. She previously took gabapentin for this, but it did not alleviate much of her symptoms. She describes her right shoulder makes it difficult to reach up and she often has to make very slow movements in order to move her right arm. She denies any radiating neuropathic pain. She has not tried any over the counter medications for this. Dana Schultz did previously have steroid injections  in the shoulder, however she would like to avoid this if at all possible.   Discussed with her conservative management, including Tylenol and voltaren gel for this. Would consider addition of duloxetine at next visit. Would be cautious about gabapentinoids given her age. If conservative management does not manage her symptoms would re-visit steroid injections.  - Tylenol '1000mg'$  every 6 hours - Voltaren gel as needed  Health care maintenance -Patient agreed to Tdap today, would like to hold off on PCV today.  -Will also screen for diabetes, check BMP today  Hyperlipidemia Patient reports once she ran out of her medication she did not know to pick this up further. She was previously on rosuvastatin '20mg'$  with last LDL 190. We will plan to repeat lipid panel today.  - Follow-up lipid panel  Insomnia Patient reports she often has time falling asleep, sometimes only getting 2-3 hours of sleep. She describes often having screen time with Facebook prior to bedtimes. She typically does not have caffeine except in the morning, no alcohol use. She sometimes will be in the bedroom prior to bedtime. Has trialed melatonin in the past, typically taking it right before she goes to bed. States this has not worked well for her. When she does not have much sleep she tends to get tension-like headaches that last only 10-15 minutes at a time. These are chronic in nature.   Today we discussed sleep habits to help improve her sleep, including avoiding screen time before bed and using the bedroom for sleep only. We also discussed trying melatonin  a few hours prior to bedtime instead. Would be cautious using sedative medications with this patient given her age.  - Alter sleep habits - Melatonin '3mg'$  a few hours before bedtime   Patient discussed with Dr. Jacquenette Shone, MD Internal Medicine PGY-3 Pager: [phone]

## 2022-08-14 NOTE — Patient Instructions (Signed)
[name], it was a pleasure seeing you today!  Today we discussed: - We are going to check lab work today. (Check your kidneys, cholesterol, and screen for diabetes)  - For your shoulder pain, I want you to take Tylenol every day 4 times daily. I also want you to use the voltaren gel (an anti-inflammatory cream). You can also buy Biofreeze over the counter to help as well.   - For sleep, I want you to take melatonin 2-3 hours before bedtime each night. Also, please reduce your screen time in the hours before you go to bed. Try staying out of the bedroom until it is time to go to sleep. Avoid caffeine and alcohol in the afternoons.   - You received your tetanus shot today.   I have ordered the following labs today:  Lab Orders         BMP8+Anion Gap         Lipid Profile         Hemoglobin A1c      I have ordered the following medication/changed the following medications:   Start the following medications: Meds ordered this encounter  Medications   acetaminophen (TYLENOL) 500 MG tablet    Sig: Take 2 tablets (1,000 mg total) by mouth every 6 (six) hours.    Dispense:  120 tablet    Refill:  2   diclofenac Sodium (VOLTAREN ARTHRITIS PAIN) 1 % GEL    Sig: Apply 2 g topically 4 (four) times daily.    Dispense:  100 g    Refill:  1   melatonin 3 MG TABS tablet    Sig: Take 1 tablet (3 mg total) by mouth at bedtime. Take 2-3 hours before bedtime    Dispense:  30 tablet    Refill:  0     Follow-up: 6 months   Please make sure to arrive 15 minutes prior to your next appointment. If you arrive late, you may be asked to reschedule.   We look forward to seeing you next time. Please call our clinic at [phone] if you have any questions or concerns. The best time to call is Monday-Friday from 9am-4pm, but there is someone available 24/7. If after hours or the weekend, call the main hospital number and ask for the Internal Medicine Resident On-Call. If you need medication refills,  please notify your pharmacy one week in advance and they will send [place] a request.  Thank you for letting us take part in your care. Wishing you the best!  Thank you, Sanjuan Dame, MD

## 2022-08-14 NOTE — Assessment & Plan Note (Addendum)
-  Patient agreed to Tdap today, would like to hold off on PCV today.  -Will also screen for diabetes, check BMP today

## 2022-08-14 NOTE — Assessment & Plan Note (Signed)
Patient reports she often has time falling asleep, sometimes only getting 2-3 hours of sleep. She describes often having screen time with Facebook prior to bedtimes. She typically does not have caffeine except in the morning, no alcohol use. She sometimes will be in the bedroom prior to bedtime. Has trialed melatonin in the past, typically taking it right before she goes to bed. States this has not worked well for her. When she does not have much sleep she tends to get tension-like headaches that last only 10-15 minutes at a time. These are chronic in nature.   Today we discussed sleep habits to help improve her sleep, including avoiding screen time before bed and using the bedroom for sleep only. We also discussed trying melatonin a few hours prior to bedtime instead. Would be cautious using sedative medications with this patient given her age.  - Alter sleep habits - Melatonin '3mg'$  a few hours before bedtime

## 2022-08-14 NOTE — Assessment & Plan Note (Signed)
Patient reports continued right shoulder pain. She previously took gabapentin for this, but it did not alleviate much of her symptoms. She describes her right shoulder makes it difficult to reach up and she often has to make very slow movements in order to move her right arm. She denies any radiating neuropathic pain. She has not tried any over the counter medications for this. Ms. Wilhelm did previously have steroid injections in the shoulder, however she would like to avoid this if at all possible.   Discussed with her conservative management, including Tylenol and voltaren gel for this. Would consider addition of duloxetine at next visit. Would be cautious about gabapentinoids given her age. If conservative management does not manage her symptoms would re-visit steroid injections.  - Tylenol '1000mg'$  every 6 hours - Voltaren gel as needed

## 2022-08-15 LAB — LIPID PANEL
Chol/HDL Ratio: 4.2 ratio (ref 0.0–4.4)
Cholesterol, Total: 296 mg/dL — ABNORMAL HIGH (ref 100–199)
HDL: 70 mg/dL (ref >39)
LDL Chol Calc (NIH): 198 mg/dL — ABNORMAL HIGH (ref 0–99)
Triglycerides: 153 mg/dL — ABNORMAL HIGH (ref 0–149)
VLDL Cholesterol Cal: 28 mg/dL (ref 5–40)

## 2022-08-15 LAB — BMP8+ANION GAP
Anion Gap: 15 mmol/L (ref 10.0–18.0)
BUN/Creatinine Ratio: 12 (ref 12–28)
BUN: 12 mg/dL (ref 8–27)
CO2: 27 mmol/L (ref 20–29)
Calcium: 9.3 mg/dL (ref 8.7–10.3)
Chloride: 102 mmol/L (ref 96–106)
Creatinine, Ser: 1 mg/dL (ref 0.57–1.00)
Glucose: 72 mg/dL (ref 70–99)
Potassium: 4.5 mmol/L (ref 3.5–5.2)
Sodium: 144 mmol/L (ref 134–144)
eGFR: 61 mL/min/{1.73_m2} (ref >59)

## 2022-08-15 LAB — HEMOGLOBIN A1C
Est. average glucose Bld gHb Est-mCnc: 108 mg/dL
Hgb A1c MFr Bld: 5.4 % (ref 4.8–5.6)

## 2022-08-15 NOTE — Progress Notes (Signed)
Internal Medicine Clinic Attending ? ?Case discussed with Dr. Braswell  At the time of the visit.  We reviewed the resident?s history and exam and pertinent patient test results.  I agree with the assessment, diagnosis, and plan of care documented in the resident?s note.  ?

## 2022-08-20 ENCOUNTER — Encounter: Payer: Self-pay | Admitting: Student

## 2022-08-20 MED ORDER — ROSUVASTATIN CALCIUM 20 MG PO TABS: 20.0000 mg | ORAL_TABLET | Freq: Every day | ORAL | 3 refills | Status: AC

## 2022-08-20 NOTE — Addendum Note (Signed)
Addended bySanjuan Dame on: 08/20/2022 01:17 PM   Modules accepted: Orders

## 2022-09-25 ENCOUNTER — Encounter: Payer: Self-pay | Admitting: Internal Medicine

## 2022-09-26 ENCOUNTER — Other Ambulatory Visit: Payer: Self-pay | Admitting: Internal Medicine

## 2022-09-26 DIAGNOSIS — Z1231 Encounter for screening mammogram for malignant neoplasm of breast: Secondary | ICD-10-CM

## 2022-10-11 IMAGING — MG MM DIGITAL SCREENING BILAT W/ TOMO AND CAD
8 series · 9 of 24 positions shown · non-contrast
Comparison: Previous exam(s).

CLINICAL DATA: Screening.

EXAM:
DIGITAL SCREENING BILATERAL MAMMOGRAM WITH TOMOSYNTHESIS AND CAD
TECHNIQUE: Bilateral screening digital craniocaudal and mediolateral oblique
mammograms were obtained. Bilateral screening digital breast
tomosynthesis was performed. The images were evaluated with
computer-aided detection.

[L MLO synth-2D]
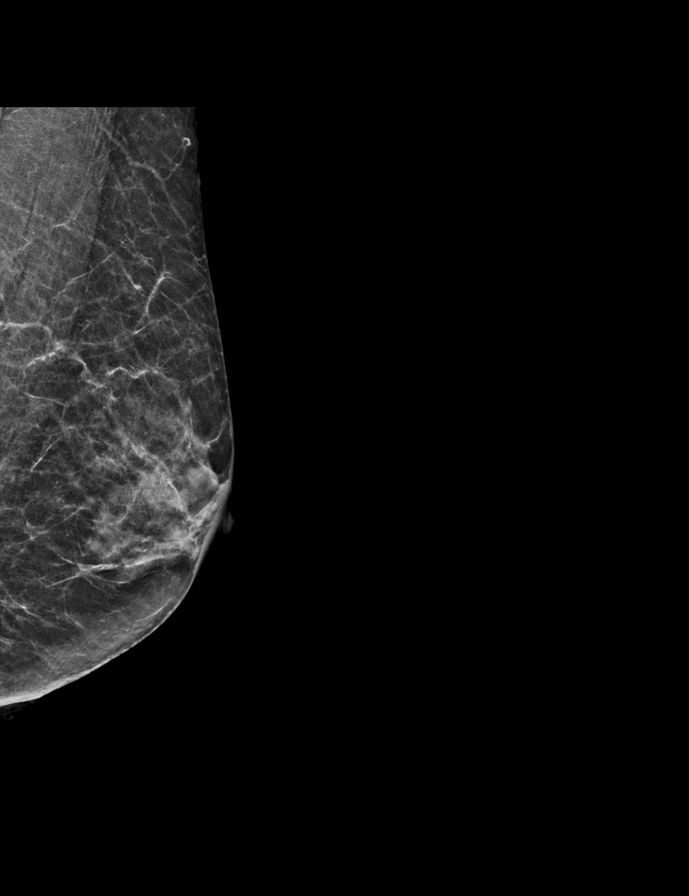

[R MLO synth-2D]
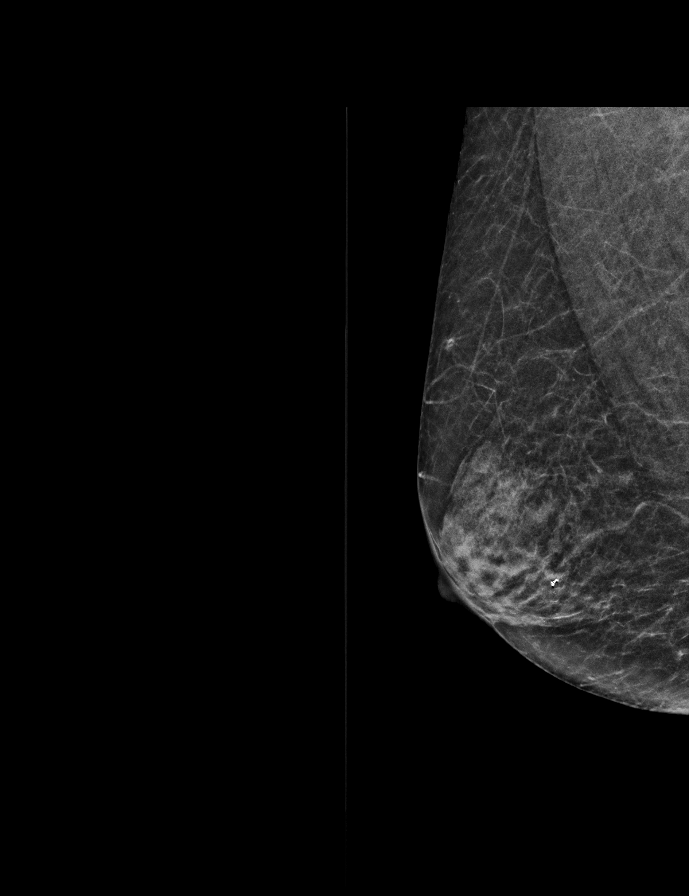

[L CC synth-2D]
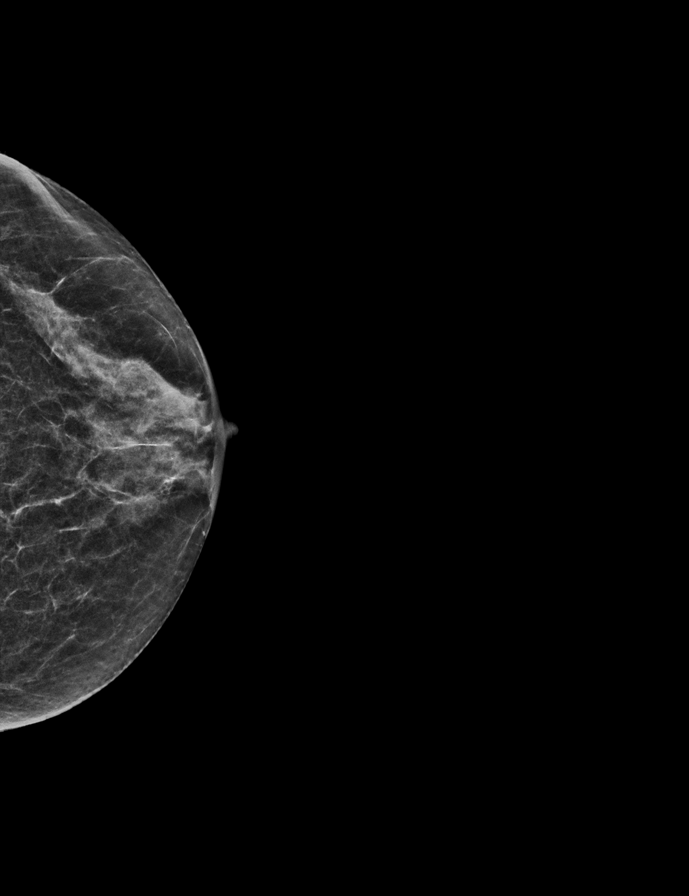

[R CC synth-2D]
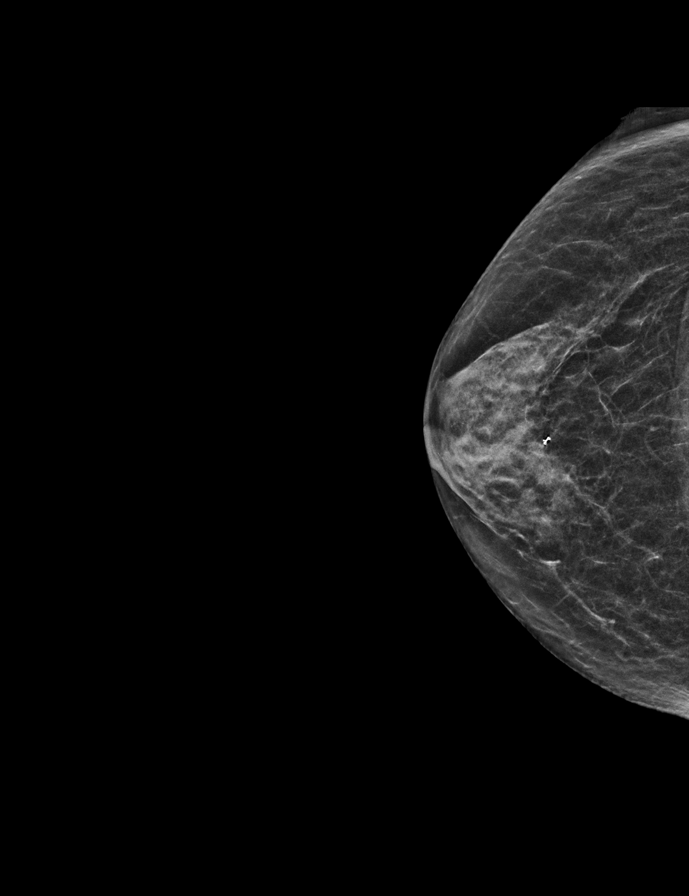

[L MLO tomo · 2 of 40 frames shown]
[frame 13/40]
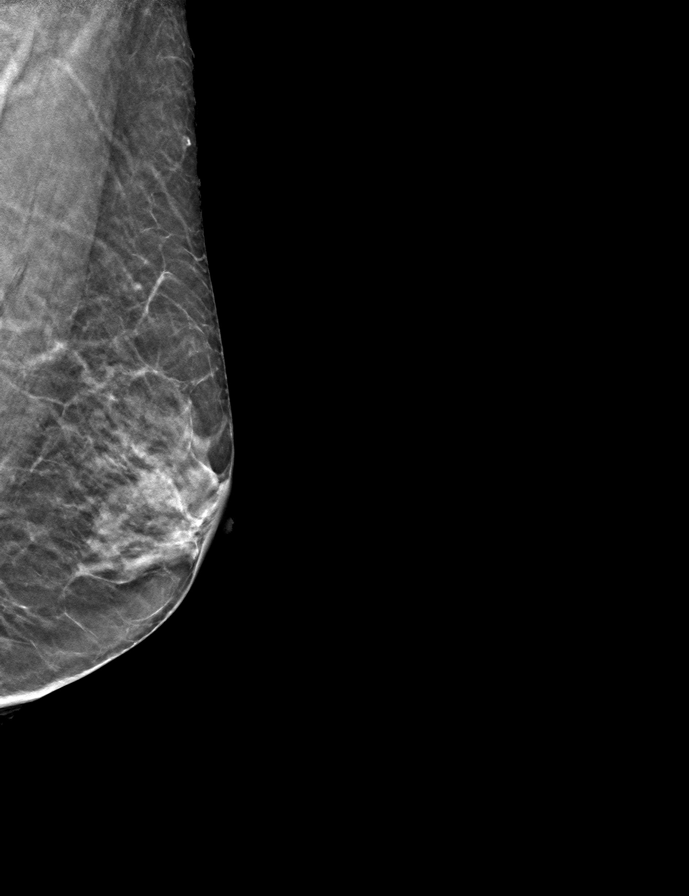
[frame 21/40]
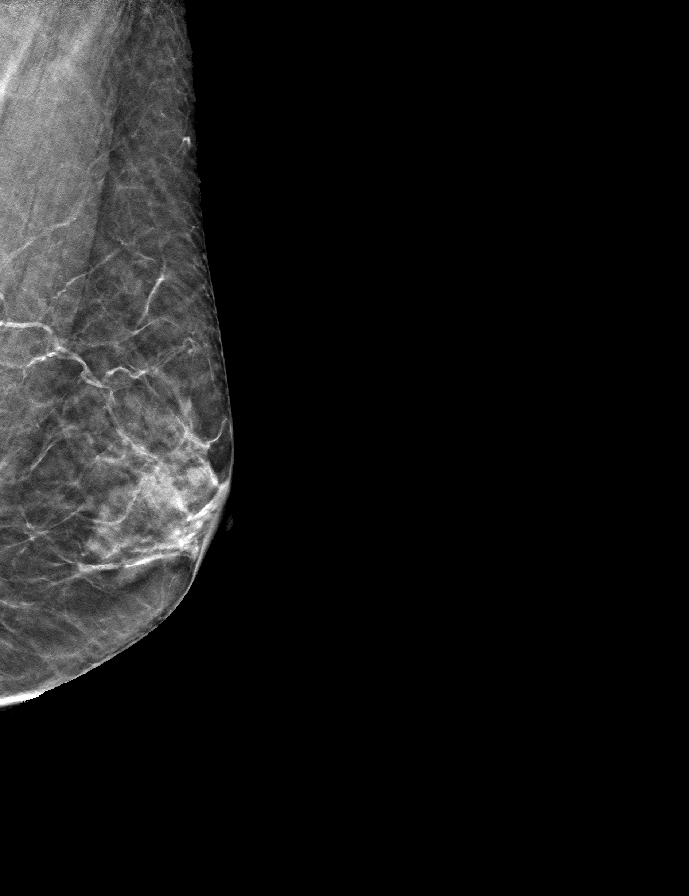

[L CC tomo · tomo slice 21/40.0]
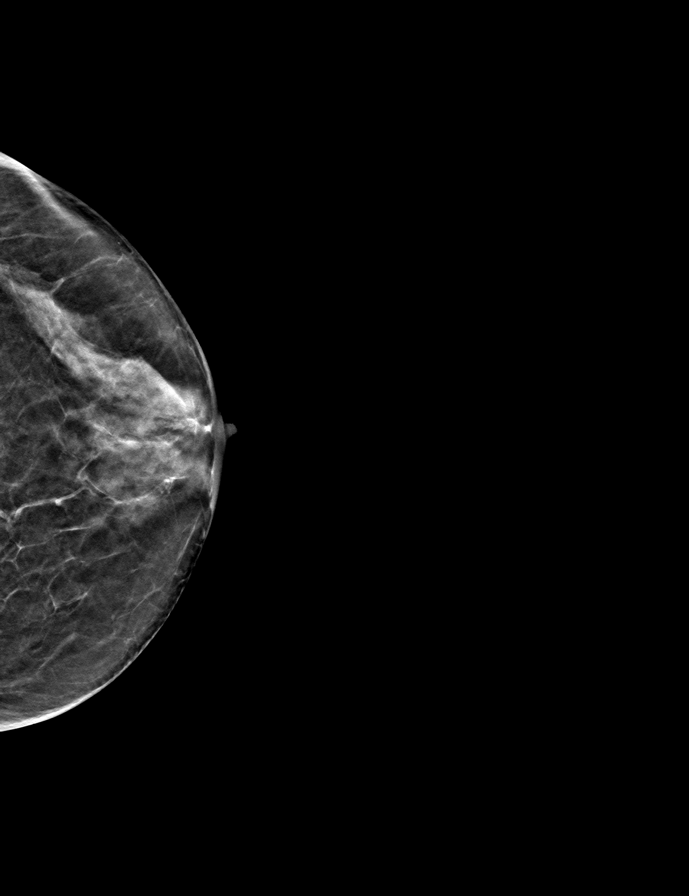

[R CC tomo · tomo slice 21/41.0]
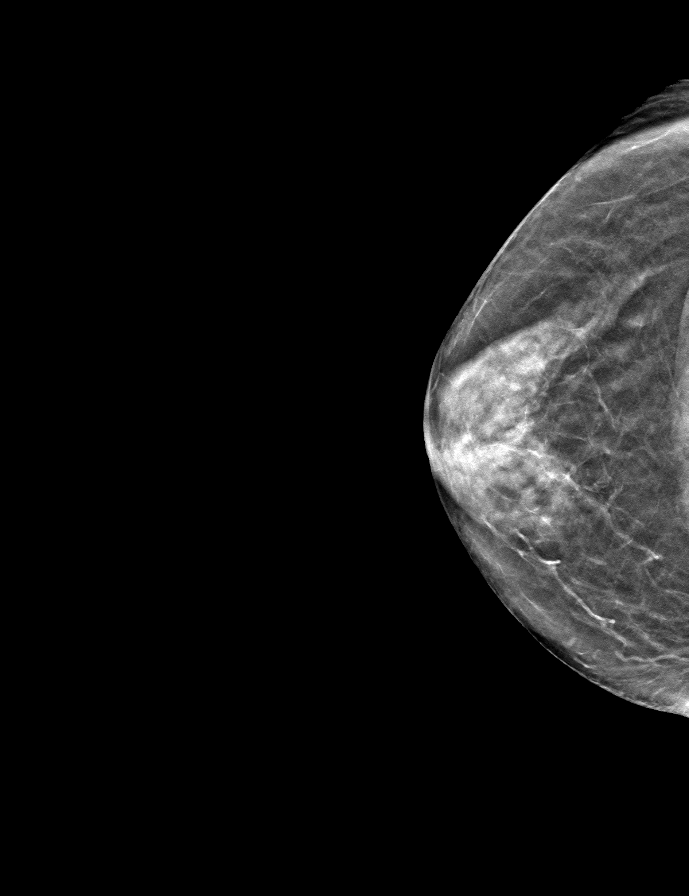

[R MLO tomo · tomo slice 19/37.0]
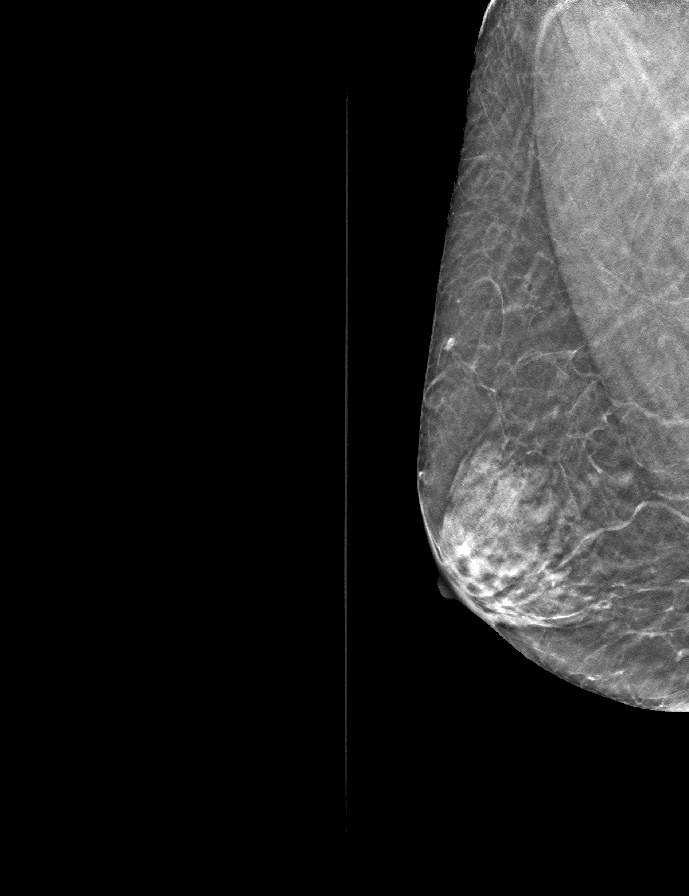

[9 of 24 positions shown; findings below may reference images not displayed]

ACR Breast Density Category c: The breast tissue is heterogeneously
dense, which may obscure small masses.
FINDINGS: There are no findings suspicious for malignancy.
IMPRESSION: No mammographic evidence of malignancy. A result letter of this
screening mammogram will be mailed directly to the patient.

RECOMMENDATION:
Screening mammogram in one year. (Code:[id number])

BI-RADS CATEGORY  1: Negative.

## 2022-11-05 ENCOUNTER — Ambulatory Visit
Admission: RE | Admit: 2022-11-05 | Discharge: 2022-11-05 | Disposition: A | Discharge status: Home / Self Care | Payer: Medicaid Other | Source: Ambulatory Visit | Attending: Internal Medicine | Admitting: Internal Medicine

## 2022-11-05 DIAGNOSIS — Z1231 Encounter for screening mammogram for malignant neoplasm of breast: Secondary | ICD-10-CM

## 2023-05-26 ENCOUNTER — Ambulatory Visit (AMBULATORY_SURGERY_CENTER): Payer: Medicaid Other

## 2023-05-26 VITALS — Ht 59.0 in | Wt 87.2 lb

## 2023-05-26 DIAGNOSIS — Z860100 Personal history of colon polyps, unspecified: Secondary | ICD-10-CM

## 2023-05-26 MED ORDER — NA SULFATE-K SULFATE-MG SULF 17.5-3.13-1.6 GM/177ML PO SOLN: 1.0000 | Freq: Once | ORAL | 0 refills | Status: AC

## 2023-05-26 NOTE — Progress Notes (Signed)

## 2023-06-23 ENCOUNTER — Encounter: Payer: Medicaid Other | Admitting: Internal Medicine

## 2023-08-07 ENCOUNTER — Encounter: Payer: Self-pay | Admitting: Certified Registered Nurse Anesthetist

## 2023-08-11 ENCOUNTER — Encounter: Payer: Medicaid Other | Admitting: Internal Medicine

## 2023-09-18 ENCOUNTER — Telehealth: Payer: Self-pay | Admitting: Internal Medicine

## 2023-09-18 NOTE — Telephone Encounter (Signed)
 Good afternoon Dr. Rhea Belton,   Patient's daughter requested to cancel patient's colonoscopy scheduled for tomorrow due to being unable to bring patient. States she will call back to reschedule.   Thank you.

## 2023-09-19 ENCOUNTER — Encounter: Payer: Medicaid Other | Admitting: Internal Medicine

## 2024-07-05 ENCOUNTER — Other Ambulatory Visit: Payer: Self-pay

## 2024-07-05 ENCOUNTER — Encounter: Payer: Self-pay | Admitting: Student

## 2024-07-05 ENCOUNTER — Ambulatory Visit: Payer: Self-pay | Admitting: Student

## 2024-07-05 VITALS — BP 130/75 | HR 85 | Temp 98.5°F | Ht 59.0 in | Wt 91.2 lb

## 2024-07-05 DIAGNOSIS — R42 Dizziness and giddiness: Secondary | ICD-10-CM

## 2024-07-05 DIAGNOSIS — E785 Hyperlipidemia, unspecified: Secondary | ICD-10-CM

## 2024-07-05 DIAGNOSIS — E782 Mixed hyperlipidemia: Secondary | ICD-10-CM

## 2024-07-05 MED ORDER — MECLIZINE HCL 12.5 MG PO TABS: 12.5000 mg | ORAL_TABLET | Freq: Three times a day (TID) | ORAL | 0 refills | Status: AC | PRN

## 2024-07-05 NOTE — Progress Notes (Unsigned)
 "  CC: Light headedness   LOV was Feb 2024  HPI:  [name] is a 69 y.o. female living with a history stated below and presents today for lightheadedness and dizziness. Please see problem based assessment and plan for additional details.  Past Medical History:  Diagnosis Date   Cataract 2019   bilateral   Cervical spondylosis with radiculopathy 05/01/2012   Cystitis 05/01/2012   Diverticulosis    Mild acid reflux 05/01/2012   Rotator cuff arthropathy 05/01/2012   Tubular adenoma     Medications Ordered Prior to Encounter[1]  Family History  Problem Relation Age of Onset   Cancer Neg Hx    Heart disease Neg Hx    Stroke Neg Hx    Colon cancer Neg Hx    Stomach cancer Neg Hx    Breast cancer Neg Hx    Rectal cancer Neg Hx     Social History   Socioeconomic History   Marital status: Single    Spouse name: Not on file   Number of children: 0   Years of education: Not on file   Highest education level: Not on file  Occupational History   Not on file  Tobacco Use   Smoking status: Never   Smokeless tobacco: Never  Vaping Use   Vaping status: Never Used  Substance and Sexual Activity   Alcohol use: No    Alcohol/week: 0.0 standard drinks of alcohol   Drug use: No   Sexual activity: Not Currently    Partners: Male  Other Topics Concern   Not on file  Social History Narrative   Immigrated to [place]  in 59 from [place].  Lived in [place]  until about 4 years ago when she moved to [place] .  Lives with her niece.   Social Drivers of Health   Tobacco Use: Low Risk (07/05/2024)   Patient History    Smoking Tobacco Use: Never    Smokeless Tobacco Use: Never    Passive Exposure: Not on file  Financial Resource Strain: Low Risk (07/05/2024)   Overall Financial Resource Strain (CARDIA)    Difficulty of Paying Living Expenses: Not hard at all  Food Insecurity: No Food Insecurity (07/05/2024)   Epic    Worried About Programme Researcher, Broadcasting/film/video in the  Last Year: Never true    Ran Out of Food in the Last Year: Never true  Transportation Needs: No Transportation Needs (07/05/2024)   Epic    Lack of Transportation (Medical): No    Lack of Transportation (Non-Medical): No  Physical Activity: Not on file  Stress: No Stress Concern Present (07/05/2024)   [employer] of Occupational Health - Occupational Stress Questionnaire    Feeling of Stress: Only a little  Social Connections: Unknown (07/05/2024)   Social Connection and Isolation Panel    Frequency of Communication with Friends and Family: More than three times a week    Frequency of Social Gatherings with Friends and Family: Once a week    Attends Religious Services: 1 to 4 times per year    Active Member of [employer] or Organizations: No    Attends Banker Meetings: Not on file    Marital Status: Not on file  Intimate Partner Violence: Not At Risk (08/14/2022)   Humiliation, Afraid, Rape, and Kick questionnaire    Fear of Current or Ex-Partner: No    Emotionally Abused: No    Physically Abused: No    Sexually Abused: No  Depression (PHQ2-9): Low  Risk (07/05/2024)   Depression (PHQ2-9)    PHQ-2 Score: 0  Alcohol Screen: Low Risk (07/05/2024)   Alcohol Screen    Last Alcohol Screening Score (AUDIT): 0  Housing: Unknown (07/05/2024)   Epic    Unable to Pay for Housing in the Last Year: No    Number of Times Moved in the Last Year: Not on file    Homeless in the Last Year: No  Utilities: Not At Risk (08/14/2022)   AHC Utilities    Threatened with loss of utilities: No  Health Literacy: Inadequate Health Literacy (07/05/2024)   B1300 Health Literacy    Frequency of need for help with medical instructions: Always    Review of Systems: [name] negative except for what is noted on the assessment and plan.  Vitals:   07/05/24 1335  BP: 130/75  Pulse: 85  Temp: 98.5 F (36.9 C)  TempSrc: Oral  SpO2: 96%  Weight: 91 lb 3.2 oz (41.4 kg)  Height: 4' 11 (1.499 m)     Physical Exam: Constitutional: well-appearing woman,  sitting in chair , in no acute distress HENT: normocephalic atraumatic, mucous membranes moist.Ear are patent  Eyes: conjunctiva non-erythematous Cardiovascular: regular rate and rhythm, no m/r/g Pulmonary/Chest: normal work of breathing on room air, lungs clear to auscultation bilaterally Neurological: alert & oriented x 3 Skin: warm and dry. Normal skin for stated age. Psych: normal mood and behavior  Assessment & Plan:   Dizzinesses The patient reports intermittent lightheadedness. Her most recent episode occurred last Thursday while putting on a shirt, at which time she became lightheaded and sat down, resulting in symptom resolution. Previous episodes have similarly resolved after lying down for approximately one hour. She denies nausea, vomiting, ear pain, hearing loss, headache, visual changes, or focal weakness. She is not taking thiazide diuretics, ACE inhibitors, or beta-blockers. On examination, there are no focal neurologic deficits. In-office Dix-Hallpike maneuver was negative, with no observed nystagmus and no reported vertigo. Orthostatic vital signs were negative; however, her symptoms are precipitated by transitioning from a seated to a standing position, raising concern for orthostatic intolerance despite normal measurements. She reports adequate oral intake and hydration. Given that her episodes are intermittent and not characterized by continuous vertigo lasting days, vestibular neuritis is unlikely, particularly in the absence of vertigo, nausea, or gait instability. Benign paroxysmal positional vertigo is also unlikely given the negative Dix-Hallpike maneuver and absence of positional vertigo. There are no red flags to suggest posterior circulation stroke.Overall, her presentation is most likely due  episodic lightheadedness/presyncope in the setting of orthostatics rather than a primary vestibular disorder or  stroke. Plan: Encourage increased hydration Recommend use of compression stockings Advise slow positional changes, particularly when rising from sitting or getting out of bed in the morning Trial a short course (2-3 days) of meclizine  for symptomatic relief if needed,kidney function reviewed  Refer to vestibular physical therapy for further evaluation and management Follow up as needed   Hyperlipidemia The patient has a history of hyperlipidemia and was previously prescribed rosuvastatin  20 mg daily; however, she has not refilled this medication since 2024 and has been lost to follow-up. She currently denies headaches or symptoms of claudication. On physical examination, no xanthomas were appreciated. Her blood pressure today is 130/75 mmHg. Given a previously documented LDL cholesterol level of 198 mg/dL two years ago, a repeat lipid panel will be obtained. The patient was counseled on the importance of statin therapy and medication adherence, and she was encouraged to resume statin  treatment pending updated laboratory results. - ASCVD risk based on lipid profile in 2024 is 10.5% - Repeat Lipid panel - Reach out to patient after labs result - If LDL remains above goal,prescribe/ initiate patient on a higher dose than before. Counsel on her risk and the needed for statins using an interpretor Publishing Rights Manager)   Return in   Patient discussed with Dr. Lovie Drue Grow, M.D [facility] Phone: [phone] Date 07/06/2024 Time 8:11 AM      [1]  Current Outpatient Medications on File Prior to Visit  Medication Sig Dispense Refill   rosuvastatin  (CRESTOR ) 20 MG tablet Take 1 tablet (20 mg total) by mouth daily at 6 PM. (Patient not taking: Reported on 05/26/2023) 90 tablet 3   Current Facility-Administered Medications on File Prior to Visit  Medication Dose Route Frequency Provider Last Rate Last Admin   0.9 %  sodium chloride  infusion  500 mL Intravenous Once Pyrtle,  Gordy HERO, MD       "

## 2024-07-05 NOTE — Patient Instructions (Addendum)
 Thank you, Ms.Laren N Mccune for allowing us  to provide your care today. Today we discussed your dizziness.    You felt dizzy when we stood up from a seated position , I suspect that could be causing your dizziness. I am also referring you to a physical therapist who specialize in vertigo for further assess you.   I sent meclizine  to your pharmacy as discussed. PLEASE don't take it for more than 3 days. Lets try compression socks as well.  Lets recheck your cholesterol levels too.   I have ordered the following labs for you:  Lab Orders         Lipid Profile      Tests ordered today:    Referrals ordered today:   Referral Orders         Amb Ref to Vestibular Evaluation      I have ordered the following medication/changed the following medications:   Stop the following medications: Medications Discontinued During This Encounter  Medication Reason   diclofenac  Sodium (VOLTAREN  ARTHRITIS PAIN) 1 % GEL    gabapentin  (NEURONTIN ) 300 MG capsule    melatonin 3 MG TABS tablet    acetaminophen  (TYLENOL ) 500 MG tablet      Start the following medications: Meds ordered this encounter  Medications   meclizine  (ANTIVERT ) 12.5 MG tablet    Sig: Take 1 tablet (12.5 mg total) by mouth 3 (three) times daily as needed for dizziness.    Dispense:  20 tablet    Refill:  0     Follow up: 2 months or earlier if needed    Remember:   Should you have any questions or concerns please call the internal medicine clinic at [phone].   Drue Lisa Grow MD 07/05/2024, 3:18 PM   [facility]

## 2024-07-06 DIAGNOSIS — R42 Dizziness and giddiness: Secondary | ICD-10-CM | POA: Insufficient documentation

## 2024-07-06 LAB — LIPID PANEL
Chol/HDL Ratio: 3.9 ratio (ref 0.0–4.4)
Cholesterol, Total: 276 mg/dL — ABNORMAL HIGH (ref 100–199)
HDL: 71 mg/dL
LDL Chol Calc (NIH): 182 mg/dL — ABNORMAL HIGH (ref 0–99)
Triglycerides: 131 mg/dL (ref 0–149)
VLDL Cholesterol Cal: 23 mg/dL (ref 5–40)

## 2024-07-06 NOTE — Assessment & Plan Note (Addendum)
 The patient reports intermittent lightheadedness. Her most recent episode occurred last Thursday while putting on a shirt, at which time she became lightheaded and sat down, resulting in symptom resolution. Previous episodes have similarly resolved after lying down for approximately one hour. She denies nausea, vomiting, ear pain, hearing loss, headache, visual changes, or focal weakness. She is not taking thiazide diuretics, ACE inhibitors, or beta-blockers. On examination, there are no focal neurologic deficits. In-office Dix-Hallpike maneuver was negative, with no observed nystagmus and no reported vertigo. Orthostatic vital signs were negative; however, her symptoms are precipitated by transitioning from a seated to a standing position, raising concern for orthostatic intolerance despite normal measurements. She reports adequate oral intake and hydration. Given that her episodes are intermittent and not characterized by continuous vertigo lasting days, vestibular neuritis is unlikely, particularly in the absence of vertigo, nausea, or gait instability. Benign paroxysmal positional vertigo is also unlikely given the negative Dix-Hallpike maneuver and absence of positional vertigo. There are no red flags to suggest posterior circulation stroke.Overall, her presentation is most likely due  episodic lightheadedness/presyncope in the setting of orthostatics rather than a primary vestibular disorder or stroke. Plan: Encourage increased hydration Recommend use of compression stockings Advise slow positional changes, particularly when rising from sitting or getting out of bed in the morning Trial a short course (2-3 days) of meclizine  for symptomatic relief if needed,kidney function reviewed  Refer to vestibular physical therapy for further evaluation and management Follow up as needed

## 2024-07-06 NOTE — Progress Notes (Signed)
 Internal Medicine Clinic Attending  Case discussed with the resident at the time of the visit.  We reviewed the residents history and exam and pertinent patient test results.  I agree with the assessment, diagnosis, and plan of care documented in the residents note.    Patient became dizzy with standing in clinic. Objectively, her vital signs did not demonstrate orthostatic hypotension, but the dizziness occurred with standing. Dix Halpike maneuver was negative. I agree with counseling on behavioral modifications, compression stockings, and referral to vestibular rehab.  Agree with increasing statin dose & improving adherence.

## 2024-07-06 NOTE — Assessment & Plan Note (Signed)
 The patient has a history of hyperlipidemia and was previously prescribed rosuvastatin  20 mg daily; however, she has not refilled this medication since 2024 and has been lost to follow-up. She currently denies headaches or symptoms of claudication. On physical examination, no xanthomas were appreciated. Her blood pressure today is 130/75 mmHg. Given a previously documented LDL cholesterol level of 198 mg/dL two years ago, a repeat lipid panel will be obtained. The patient was counseled on the importance of statin therapy and medication adherence, and she was encouraged to resume statin treatment pending updated laboratory results. - ASCVD risk based on lipid profile in 2024 is 10.5% - Repeat Lipid panel - Reach out to patient after labs result - If LDL remains above goal,prescribe/ initiate patient on a higher dose than before. Counsel on her risk and the needed for statins using an interpretor [employer] Rights [employer])
